# Patient Record
Sex: Male | Born: 1955
Health system: Southern US, Community
[De-identification: ages and names within clinical notes are randomized; demographics above are authoritative.]

## PROBLEM LIST (undated history)

## (undated) DIAGNOSIS — Z8669 Personal history of other diseases of the nervous system and sense organs: Secondary | ICD-10-CM

## (undated) DIAGNOSIS — Z86010 Personal history of colonic polyps: Secondary | ICD-10-CM

## (undated) DIAGNOSIS — Z860101 Personal history of adenomatous and serrated colon polyps: Secondary | ICD-10-CM

## (undated) DIAGNOSIS — Z9989 Dependence on other enabling machines and devices: Secondary | ICD-10-CM

## (undated) DIAGNOSIS — G4730 Sleep apnea, unspecified: Secondary | ICD-10-CM

## (undated) DIAGNOSIS — M1990 Unspecified osteoarthritis, unspecified site: Secondary | ICD-10-CM

## (undated) DIAGNOSIS — K635 Polyp of colon: Secondary | ICD-10-CM

## (undated) DIAGNOSIS — T7840XA Allergy, unspecified, initial encounter: Secondary | ICD-10-CM

## (undated) DIAGNOSIS — N529 Male erectile dysfunction, unspecified: Secondary | ICD-10-CM

## (undated) DIAGNOSIS — G4733 Obstructive sleep apnea (adult) (pediatric): Secondary | ICD-10-CM

## (undated) DIAGNOSIS — H409 Unspecified glaucoma: Secondary | ICD-10-CM

## (undated) DIAGNOSIS — A6000 Herpesviral infection of urogenital system, unspecified: Secondary | ICD-10-CM

## (undated) DIAGNOSIS — N400 Enlarged prostate without lower urinary tract symptoms: Secondary | ICD-10-CM

## (undated) DIAGNOSIS — R399 Unspecified symptoms and signs involving the genitourinary system: Secondary | ICD-10-CM

## (undated) DIAGNOSIS — E785 Hyperlipidemia, unspecified: Secondary | ICD-10-CM

## (undated) DIAGNOSIS — G709 Myoneural disorder, unspecified: Secondary | ICD-10-CM

## (undated) DIAGNOSIS — I10 Essential (primary) hypertension: Secondary | ICD-10-CM

## (undated) DIAGNOSIS — K219 Gastro-esophageal reflux disease without esophagitis: Secondary | ICD-10-CM

## (undated) HISTORY — DX: Herpesviral infection of urogenital system, unspecified: A60.00

## (undated) HISTORY — DX: Myoneural disorder, unspecified: G70.9

## (undated) HISTORY — DX: Essential (primary) hypertension: I10

## (undated) HISTORY — DX: Allergy, unspecified, initial encounter: T78.40XA

## (undated) HISTORY — DX: Hyperlipidemia, unspecified: E78.5

## (undated) HISTORY — PX: BUNIONECTOMY: SHX129

## (undated) HISTORY — DX: Polyp of colon: K63.5

## (undated) HISTORY — PX: EYE SURGERY: SHX253

## (undated) HISTORY — PX: SHOULDER SURGERY: SHX246

---

## 2006-12-14 ENCOUNTER — Ambulatory Visit: Payer: Self-pay | Admitting: Gastroenterology

## 2006-12-28 ENCOUNTER — Encounter: Payer: Self-pay | Admitting: Gastroenterology

## 2006-12-28 ENCOUNTER — Ambulatory Visit: Payer: Self-pay | Admitting: Gastroenterology

## 2006-12-28 HISTORY — PX: COLONOSCOPY: SHX174

## 2008-11-01 ENCOUNTER — Ambulatory Visit (HOSPITAL_BASED_OUTPATIENT_CLINIC_OR_DEPARTMENT_OTHER): Admission: RE | Admit: 2008-11-01 | Discharge: 2008-11-01 | Payer: Self-pay | Admitting: Otolaryngology

## 2008-11-07 ENCOUNTER — Ambulatory Visit: Payer: Self-pay | Admitting: Internal Medicine

## 2010-10-20 ENCOUNTER — Other Ambulatory Visit: Payer: Self-pay | Admitting: Family Medicine

## 2010-10-20 DIAGNOSIS — M79659 Pain in unspecified thigh: Secondary | ICD-10-CM

## 2010-10-24 ENCOUNTER — Ambulatory Visit
Admission: RE | Admit: 2010-10-24 | Discharge: 2010-10-24 | Disposition: A | Discharge status: Home / Self Care | Payer: Self-pay | Source: Ambulatory Visit | Attending: Family Medicine | Admitting: Family Medicine

## 2010-10-24 DIAGNOSIS — M79659 Pain in unspecified thigh: Secondary | ICD-10-CM

## 2010-12-09 NOTE — Procedures (Signed)
NAME:  Robert Carroll, Robert Carroll                 ACCOUNT NO.:  [id number]   MEDICAL RECORD NO.:  [id number]          PATIENT TYPE:  OUT   LOCATION:  SLEEP CENTER                 FACILITY:  [facility]   PHYSICIAN:  Clinton D. Maple Hudson, MD, FCCP, FACPDATE OF BIRTH:  10-23-55   DATE OF STUDY:                            NOCTURNAL POLYSOMNOGRAM   REFERRING PHYSICIAN:   REFERRING PHYSICIAN:  Christopher E. Ezzard Standing, MD   INDICATION FOR STUDY:  Hypersomnia with sleep apnea.   EPWORTH SLEEPINESS SCORE:  Epworth sleepiness score 8/24, BMI 29.7.  Weight 225 pounds, height 73 inches.  Neck 15.5 inches.   MEDICATIONS:  Home medications are charted and reviewed.   SLEEP ARCHITECTURE:  Total sleep time 110 minutes with sleep efficiency  31.8%.  Stage I was 9.5%, stage II was 90.5% of total sleep time.  Stages III and REM were absent.  Sleep latency 24 minutes.  Awake after  sleep onset 193 minutes.  Arousal index 37.1.  Bedtime medication  included lisinopril and melatonin.  He and the sleep technician both  indicated that he had difficulty sleeping and that this was a worse  night than usual with no clarification as to why he had difficulty.   RESPIRATORY DATA:  Apnea/hypopnea index (HPI) 10.9 per hour.  A total of  20 events was scored, all as hypopnea, nonpositional.  There were no REM  events and no REM.  There were insufficient events to permit CPAP  titration by split protocol on the study night.   OXYGEN DATA:  Moderate snoring with oxygen desaturation to a nadir of  81%.  Mean oxygen saturation through the study was 92.3% on room air.   CARDIAC DATA:  Normal sinus rhythm.   MOVEMENT-PARASOMNIA:  No significant movement disturbance.  No bathroom  trips.   IMPRESSIONS-RECOMMENDATIONS:  1. Insomnia with difficulty maintaining sleep for this study night, no      specific explanation.  This apparently was not typical of his sleep      pattern.  2. Mild obstructive sleep apnea/hypopnea syndrome, HPI 10.9  per hour      with nonpositional events, moderate snoring, and oxygen      desaturation to a nadir of 81%.      Clinton D. Maple Hudson, MD, [facility], FACP  Diplomate, Biomedical engineer of Sleep Medicine  Electronically Signed     CDY/MEDQ  D:  11/07/2008 10:34:23  T:  11/07/2008 16:10:96  Job:  [id number]

## 2011-07-25 HISTORY — PX: OTHER SURGICAL HISTORY: SHX169

## 2011-11-01 ENCOUNTER — Encounter: Payer: Self-pay | Admitting: Gastroenterology

## 2011-12-25 ENCOUNTER — Encounter: Payer: Self-pay | Admitting: Gastroenterology

## 2012-02-23 ENCOUNTER — Ambulatory Visit (AMBULATORY_SURGERY_CENTER): Payer: 59 | Admitting: *Deleted

## 2012-02-23 VITALS — Ht 73.0 in | Wt 228.5 lb

## 2012-02-23 DIAGNOSIS — Z1211 Encounter for screening for malignant neoplasm of colon: Secondary | ICD-10-CM

## 2012-02-23 MED ORDER — MOVIPREP 100 G PO SOLR: ORAL | Status: DC

## 2012-02-26 ENCOUNTER — Encounter: Payer: Self-pay | Admitting: Gastroenterology

## 2012-03-08 ENCOUNTER — Ambulatory Visit (AMBULATORY_SURGERY_CENTER): Payer: 59 | Admitting: Gastroenterology

## 2012-03-08 ENCOUNTER — Encounter: Payer: Self-pay | Admitting: Gastroenterology

## 2012-03-08 VITALS — BP 129/77 | HR 56 | Temp 96.7°F | Resp 18 | Ht 73.0 in | Wt 228.0 lb

## 2012-03-08 DIAGNOSIS — D126 Benign neoplasm of colon, unspecified: Secondary | ICD-10-CM

## 2012-03-08 DIAGNOSIS — K635 Polyp of colon: Secondary | ICD-10-CM

## 2012-03-08 DIAGNOSIS — Z86010 Personal history of colonic polyps: Secondary | ICD-10-CM

## 2012-03-08 DIAGNOSIS — Z1211 Encounter for screening for malignant neoplasm of colon: Secondary | ICD-10-CM

## 2012-03-08 MED ORDER — SODIUM CHLORIDE 0.9 % IV SOLN: 500.0000 mL | INTRAVENOUS | Status: DC

## 2012-03-08 NOTE — Op Note (Signed)
 [facility] [address] [place], [place]  [address]  COLONOSCOPY PROCEDURE REPORT  PATIENT:  Robert Carroll, Robert Carroll  MR#:  [id number] BIRTHDATE:  1956-06-30, 55 yrs. old  GENDER:  male ENDOSCOPIST:  Rachael Fee, MD PROCEDURE DATE:  03/08/2012 PROCEDURE:  Colonoscopy with biopsy ASA CLASS:  Class II INDICATIONS:  adenomatous polyp removed 2008 MEDICATIONS:   Fentanyl 50 mcg IV, These medications were titrated to patient response per physician's verbal order, Versed 6 mg IV  DESCRIPTION OF PROCEDURE:   After the risks benefits and alternatives of the procedure were thoroughly explained, informed consent was obtained.  Digital rectal exam was performed and revealed no rectal masses.   The LB [id number] endoscope was introduced through the anus and advanced to the cecum, which was identified by both the appendix and ileocecal valve, without limitations.  The quality of the prep was good..  The instrument was then slowly withdrawn as the colon was fully examined. <<PROCEDUREIMAGES>> FINDINGS:  A diminutive polyp was found in the sigmoid colon. This was removed with biopsy forceps and sent to pathology (jar 1) (see image3).  This was otherwise a normal examination of the colon (see image1, image2, and image4).   Retroflexed views in the rectum revealed no abnormalities. COMPLICATIONS:  None  ENDOSCOPIC IMPRESSION: 1) Diminutive polyp in the sigmoid colon; removed and sent to pathology 2) Otherwise normal examination  RECOMMENDATIONS: 1) Given your personal history of adenomatous (pre-cancerous) polyps, you will need a repeat colonoscopy in 5 years even if the polyp removed today is NOT pre-cancerous 2) You will receive a letter within 1-2 weeks with the results of your biopsy as well as final recommendations. Please call my office if you have not received a letter after 3 weeks.  ______________________________ Rachael Fee, MD  n. eSIGNED:   Rachael Fee at 03/08/2012 10:19 AM  [place] [employer], [phone]

## 2012-03-08 NOTE — Patient Instructions (Addendum)

## 2012-03-08 NOTE — Progress Notes (Signed)
Patient did not experience any of the following events: a burn prior to discharge; a fall within the facility; wrong site/side/patient/procedure/implant event; or a hospital transfer or hospital admission upon discharge from the facility. (G8907) Patient did not have preoperative order for IV antibiotic SSI prophylaxis. (G8918)  

## 2012-03-11 ENCOUNTER — Telehealth: Payer: Self-pay | Admitting: *Deleted

## 2012-03-11 NOTE — Telephone Encounter (Signed)
Left message that we called for f/u 

## 2012-03-14 ENCOUNTER — Encounter: Payer: Self-pay | Admitting: Gastroenterology

## 2014-04-01 ENCOUNTER — Encounter: Payer: Self-pay | Admitting: *Deleted

## 2016-03-03 ENCOUNTER — Ambulatory Visit (INDEPENDENT_AMBULATORY_CARE_PROVIDER_SITE_OTHER): Payer: 59 | Admitting: Diagnostic Neuroimaging

## 2016-03-03 ENCOUNTER — Encounter: Payer: Self-pay | Admitting: Diagnostic Neuroimaging

## 2016-03-03 VITALS — BP 129/85 | HR 71 | Ht 72.0 in | Wt 214.8 lb

## 2016-03-03 DIAGNOSIS — R200 Anesthesia of skin: Secondary | ICD-10-CM

## 2016-03-03 DIAGNOSIS — R253 Fasciculation: Secondary | ICD-10-CM

## 2016-03-03 DIAGNOSIS — R208 Other disturbances of skin sensation: Secondary | ICD-10-CM | POA: Diagnosis not present

## 2016-03-03 DIAGNOSIS — R258 Other abnormal involuntary movements: Secondary | ICD-10-CM | POA: Diagnosis not present

## 2016-03-03 DIAGNOSIS — M25512 Pain in left shoulder: Secondary | ICD-10-CM | POA: Diagnosis not present

## 2016-03-03 MED ORDER — ASPIRIN EC 81 MG PO TBEC: 81.0000 mg | DELAYED_RELEASE_TABLET | Freq: Every day | ORAL | Status: DC

## 2016-03-03 MED ORDER — ALPRAZOLAM 0.5 MG PO TABS: 0.5000 mg | ORAL_TABLET | ORAL | 0 refills | Status: DC | PRN

## 2016-03-03 NOTE — Patient Instructions (Signed)
Thank you for coming to see [place] at [facility]. I hope we have been able to provide you high quality care today.  You may receive a patient satisfaction survey over the next few weeks. We would appreciate your feedback and comments so that we may continue to improve ourselves and the health of our patients.  - monitor symptoms, sleep, stress, food, drink - I will check MRI brain and neck, EEG (brain wave test), carotid ultrasound - continue current medications   ~~~~~~~~~~~~~~~~~~~~~~~~~~~~~~~~~~~~~~~~~~~~~~~~~~~~~~~~~~~~~~~~~  DR. Jakiyah Stepney'S GUIDE TO HAPPY AND HEALTHY LIVING These are some of my general health and wellness recommendations. Some of them may apply to you better than others. Please use common sense as you try these suggestions and feel free to ask me any questions.   ACTIVITY/FITNESS Mental, social, emotional and physical stimulation are very important for brain and body health. Try learning a new activity (arts, music, language, sports, games).  Keep moving your body to the best of your abilities. You can do this at home, inside or outside, the park, community center, gym or anywhere you like. Consider a physical therapist or personal trainer to get started. Consider the app Sworkit. Fitness trackers such as smart-watches, smart-phones or Fitbits can help as well.   NUTRITION Eat more plants: colorful vegetables, nuts, seeds and berries.  Eat less sugar, salt, preservatives and processed foods.  Avoid toxins such as cigarettes and alcohol.  Drink water when you are thirsty. Warm water with a slice of lemon is an excellent morning drink to start the day.  Consider these websites for more information The Nutrition Source (https://www.henry-hernandez.biz/) Precision Nutrition (WindowBlog.ch)   RELAXATION Consider practicing mindfulness meditation or other relaxation techniques such as deep breathing,  prayer, yoga, tai chi, massage. See website mindful.org or the apps Headspace or Calm to help get started.   SLEEP Try to get at least 7-8+ hours sleep per day. Regular exercise and reduced caffeine will help you sleep better. Practice good sleep hygeine techniques. See website sleep.org for more information.   PLANNING Prepare estate planning, living will, healthcare POA documents. Sometimes this is best planned with the help of an attorney. Theconversationproject.org and agingwithdignity.org are excellent resources.

## 2016-03-03 NOTE — Progress Notes (Signed)
[facility]  PATIENT: Robert Carroll DOB: 07-15-1956  REFERRING CLINICIAN: K Supple HISTORY FROM: patient  REASON FOR VISIT: new consult    HISTORICAL  CHIEF COMPLAINT:  Chief Complaint  Patient presents with  . Numbness    rm 6, New Pt "numbness episodes periodically in L shoulder radiaitng down L arm (2.5-3 mos). Experiences weak sensation at L shoulder "    HISTORY OF PRESENT ILLNESS:   60 year old right-handed male here for evaluation of left upper extremity numbness and tingling. May 2017 patient had onset of left shoulder numbness and tingling sensation which would slowly moved down his arm into his fingers. The progression would slowly occur over 30-60 seconds and then resolve. No specific triggering factors. Patient was having multiple attacks per day, up to 3-4/hour. Initial attacks or sometimes associated with mild twitching of his left hand and fingers. Patient had some left axillary pain sensation, history of right shoulder problem, and therefore followed up with his orthopedic surgeon. Patient was evaluated for possible impingement syndrome of the left shoulder and treated with trigger point injection. Patient referred to me for evaluation and consideration of alternate explanation of the symptoms.  Over time the attacks have continued although slightly decreased in intensity and frequency. Patient denies any specific neck pain. No prodromal injuries, accidents, traumas. No history of seizure, TIA or stroke. No family history of seizure.   Patient denies any left face or left leg numbness or tingling. No loss of consciousness or altered consciousness attacks.   REVIEW OF SYSTEMS: Full 14 system review of systems performed and negative with exception of: Rash itching snoring joint pain allergies numbness weakness snoring.  ALLERGIES: No Known Allergies  HOME MEDICATIONS: Outpatient Medications Prior to Visit  Medication Sig Dispense Refill  .  amLODipine-benazepril (LOTREL) 5-20 MG per capsule Take 1 capsule by mouth daily.     Marland Kitchen CIALIS 20 MG tablet Take 20 mg by mouth as needed.     . doxazosin (CARDURA) 4 MG tablet Take 4 mg by mouth at bedtime.     . Flax Oil-Fish Oil-Borage Oil (FISH OIL-FLAX OIL-BORAGE OIL PO) Take by mouth daily.    Marland Kitchen glucosamine-chondroitin 500-400 MG tablet Take 1 tablet by mouth daily.    . Misc Natural Products (COLON CLEANSER PO) Take by mouth as needed.    . Multiple Vitamins-Minerals (MULTIVITAMIN PO) Take by mouth.    Marland Kitchen omeprazole (PRILOSEC) 20 MG capsule Take 20 mg by mouth daily.     . valACYclovir (VALTREX) 1000 MG tablet Take 1,000 mg by mouth as needed.     . simvastatin (ZOCOR) 20 MG tablet Take 20 mg by mouth at bedtime.      No facility-administered medications prior to visit.     PAST MEDICAL HISTORY: Past Medical History:  Diagnosis Date  . Colon polyp    adenomatous, 2008  . Genital herpes   . Hyperlipidemia   . Hypertension     PAST SURGICAL HISTORY: Past Surgical History:  Procedure Laterality Date  . SHOULDER SURGERY  1974, 1977   right    FAMILY HISTORY: Family History  Problem Relation Age of Onset  . Heart disease Mother   . Stroke Mother   . Heart disease Father   . Hypertension Father   . Diabetes Father   . Heart disease Sister   . Lupus Sister   . Heart disease Brother   . Hypertension Brother   . Diabetes Brother   . Heart disease Maternal Aunt   .  Heart disease Maternal Uncle   . Cancer Maternal Uncle     prostate  . Cancer Paternal Uncle     prostate  . Colon cancer Neg Hx   . Stomach cancer Neg Hx     SOCIAL HISTORY:  Social History   Social History  . Marital status: Married    Spouse name: N/A  . Number of children: 3  . Years of education: 79   Occupational History  .      [employer], administration   Social History Main Topics  . Smoking status: Never Smoker  . Smokeless tobacco: Never Used  . Alcohol use 2.0 oz/week     4 Standard drinks or equivalent per week     Comment: 3-4 weekly  . Drug use: No  . Sexual activity: Not on file   Other Topics Concern  . Not on file   Social History Narrative  . No narrative on file     PHYSICAL EXAM  GENERAL EXAM/CONSTITUTIONAL: Vitals:  Vitals:   03/03/16 0926  BP: 129/85  Pulse: 71  Weight: 214 lb 12.8 oz (97.4 kg)  Height: 6' (1.829 m)     Body mass index is 29.13 kg/m.  Visual Acuity Screening   Right eye Left eye Both eyes  Without correction: 20/30 20/30   With correction:        Patient is in no distress; well developed, nourished and groomed; neck is supple  CARDIOVASCULAR:  Examination of carotid arteries is normal; no carotid bruits  Regular rate and rhythm, no murmurs  Examination of peripheral vascular system by observation and palpation is normal  EYES:  Ophthalmoscopic exam of optic discs and posterior segments is normal; no papilledema or hemorrhages  MUSCULOSKELETAL:  Gait, strength, tone, movements noted in Neurologic exam below  NEUROLOGIC: MENTAL STATUS:  No flowsheet data found.  awake, alert, oriented to person, place and time  recent and remote memory intact  normal attention and concentration  language fluent, comprehension intact, naming intact,   fund of knowledge appropriate  CRANIAL NERVE:   2nd - no papilledema on fundoscopic exam  2nd, 3rd, 4th, 6th - pupils equal and reactive to light, visual fields full to confrontation, extraocular muscles intact, no nystagmus  5th - facial sensation symmetric  7th - facial strength symmetric  8th - hearing intact  9th - palate elevates symmetrically, uvula midline  11th - shoulder shrug symmetric  12th - tongue protrusion midline  MOTOR:   normal bulk and tone, full strength in the BUE, BLE  SENSORY:   normal and symmetric to light touch, temperature, vibration  COORDINATION:   finger-nose-finger, fine finger movements  normal  REFLEXES:   deep tendon reflexes present and symmetric  GAIT/STATION:   narrow based gait; able to walk tandem; romberg is negative    DIAGNOSTIC DATA (LABS, IMAGING, TESTING) - I reviewed patient records, labs, notes, testing and imaging myself where available.  No results found for: WBC, HGB, HCT, MCV, PLT No results found for: NA, K, CL, CO2, GLUCOSE, BUN, CREATININE, CALCIUM, PROT, ALBUMIN, AST, ALT, ALKPHOS, BILITOT, GFRNONAA, GFRAA No results found for: CHOL, HDL, LDLCALC, LDLDIRECT, TRIG, CHOLHDL No results found for: HGBA1C No results found for: VITAMINB12 No results found for: TSH     ASSESSMENT AND PLAN  60 y.o. year old male here with new onset intermittent attacks of left upper extremity numbness and tingling, with slow progression from left shoulder to the left hand and fingers. The patient's description  is highly suspicious for jacksonian march and partial seizure events. Left cervical radiculopathy, left arm peripheral neuropathy or transient ischemic attack are other considerations.   Localization: right brain (parietal), left cervical nerve roots, left arm peripheral nerves  Ddx: partial seizure, TIA, left cervical radiculopathy, left arm peripheral neuropathy  1. Left arm numbness   2. Muscle twitching   3. Left shoulder pain      PLAN: - MRI brain, cervical spine - EEG - carotid u/s  Orders Placed This Encounter  Procedures  . MR Brain W Wo Contrast  . MR Cervical Spine Wo Contrast  . EEG adult   Meds ordered this encounter  Medications  . ALPRAZolam (XANAX) 0.5 MG tablet    Sig: Take 1 tablet (0.5 mg total) by mouth as needed for anxiety (for sedation before MRI scan; take 1 hour before scan; may repeat 15 min before scan).    Dispense:  3 tablet    Refill:  0   Return in about 6 weeks (around 04/14/2016).  I reviewed images, labs, notes, records myself. I summarized findings and reviewed with patient, for this high risk condition  (seizure, TIA, cervical radiculopathy; left arm numbness) requiring high complexity decision making.     Penni Bombard, MD [id number], [id number] AM Certified in Neurology, Neurophysiology and Neuroimaging  [facility] [address] [place], [place] [address] [phone]

## 2016-03-10 ENCOUNTER — Ambulatory Visit (HOSPITAL_COMMUNITY)
Admission: RE | Admit: 2016-03-10 | Discharge: 2016-03-10 | Disposition: A | Discharge status: Home / Self Care | Payer: 59 | Source: Ambulatory Visit | Attending: Cardiovascular Disease | Admitting: Cardiovascular Disease

## 2016-03-10 DIAGNOSIS — I10 Essential (primary) hypertension: Secondary | ICD-10-CM | POA: Insufficient documentation

## 2016-03-10 DIAGNOSIS — E785 Hyperlipidemia, unspecified: Secondary | ICD-10-CM | POA: Diagnosis not present

## 2016-03-10 DIAGNOSIS — R200 Anesthesia of skin: Secondary | ICD-10-CM

## 2016-03-10 DIAGNOSIS — I6523 Occlusion and stenosis of bilateral carotid arteries: Secondary | ICD-10-CM | POA: Insufficient documentation

## 2016-03-10 DIAGNOSIS — R208 Other disturbances of skin sensation: Secondary | ICD-10-CM | POA: Insufficient documentation

## 2016-03-16 ENCOUNTER — Telehealth: Payer: Self-pay | Admitting: Diagnostic Neuroimaging

## 2016-03-16 ENCOUNTER — Telehealth: Payer: Self-pay | Admitting: *Deleted

## 2016-03-16 NOTE — Telephone Encounter (Signed)
Returned patient's call and left him a VM asking him to return my call.

## 2016-03-16 NOTE — Telephone Encounter (Signed)
Pt called back to speak with Danielle. She will call him back .

## 2016-03-16 NOTE — Telephone Encounter (Signed)
Pt called to set up MRI appt

## 2016-03-16 NOTE — Telephone Encounter (Signed)
Per Dr Leta Baptist, spoke with patient and informed him his carotid US results are unremarkable; Dr Leta Baptist will continue with his current treatment plan. Patient stated his MRI and EEG are scheduled. Advised he will get a call with those results when available. He verbalized understanding, appreciation for call.

## 2016-03-16 NOTE — Telephone Encounter (Signed)
Called patient back and scheduled injection.

## 2016-03-16 NOTE — Telephone Encounter (Signed)
Pt returned call

## 2016-03-22 ENCOUNTER — Ambulatory Visit (INDEPENDENT_AMBULATORY_CARE_PROVIDER_SITE_OTHER): Payer: 59

## 2016-03-22 DIAGNOSIS — R200 Anesthesia of skin: Secondary | ICD-10-CM

## 2016-03-22 DIAGNOSIS — R208 Other disturbances of skin sensation: Secondary | ICD-10-CM | POA: Diagnosis not present

## 2016-03-22 DIAGNOSIS — R258 Other abnormal involuntary movements: Secondary | ICD-10-CM | POA: Diagnosis not present

## 2016-03-22 DIAGNOSIS — M25512 Pain in left shoulder: Secondary | ICD-10-CM

## 2016-03-22 DIAGNOSIS — R253 Fasciculation: Secondary | ICD-10-CM

## 2016-03-23 MED ORDER — GADOPENTETATE DIMEGLUMINE 469.01 MG/ML IV SOLN: 20.0000 mL | Freq: Once | INTRAVENOUS | Status: AC | PRN

## 2016-03-28 ENCOUNTER — Telehealth: Payer: Self-pay | Admitting: *Deleted

## 2016-03-28 NOTE — Telephone Encounter (Signed)
Spoke with patient who inquired about his MRI of neck. He stated he was told it wasn't approved but an appeal could be made.Robert Carroll He stated he wanted to know if it should be scheduled now or wait until his EEG is done. Advised him that since his EEG is this Thurs, MRI may not be able to be done before that date anyway.  Advised him this RN will send message to Andee Poles to check into this and call him back. He verbalized understanding, appreciation.

## 2016-03-28 NOTE — Telephone Encounter (Signed)
Per Dr Leta Baptist, lvm informing patient his MRI brain results are unremarkable. Dr Leta Baptist will continue with his current treatment plan. Reminded him of EEG on 03/30/16 and FU on 04/14/16. Left name, number for any questions.

## 2016-03-28 NOTE — Telephone Encounter (Signed)
Patient is calling regarding his MRI. He has questions.

## 2016-03-30 ENCOUNTER — Ambulatory Visit (INDEPENDENT_AMBULATORY_CARE_PROVIDER_SITE_OTHER): Payer: 59 | Admitting: Diagnostic Neuroimaging

## 2016-03-30 DIAGNOSIS — R208 Other disturbances of skin sensation: Secondary | ICD-10-CM | POA: Diagnosis not present

## 2016-03-30 DIAGNOSIS — R253 Fasciculation: Secondary | ICD-10-CM

## 2016-03-30 DIAGNOSIS — M25512 Pain in left shoulder: Secondary | ICD-10-CM

## 2016-03-30 DIAGNOSIS — R200 Anesthesia of skin: Secondary | ICD-10-CM

## 2016-04-06 NOTE — Telephone Encounter (Signed)
Spoke with patient. His cervical MRI was denied. I spoke with the patient the day of the apt and let him know. I can file an appeal if Dr. Leta Baptist thinks it is necessary. Patient stated that he is still waiting for results of EEG. Please call and advise.

## 2016-04-06 NOTE — Telephone Encounter (Addendum)
Per Dr Leta Baptist, spoke with patient and informed him his EEG results are normal.  He then asked if his MRI will be appealed.  He stated that since his other tests were normal, and he is still having problems he thinks MRI of his neck  should be done. Informed him this RN will route to Dr Leta Baptist. Did advise Dr Leta Baptist and this RN are out of the office tomorrow, return Monday. However did inform him the other RNs cover phone calls as they are able. He verbalized understanding, appreciation.  Message routed to Julian Reil, who does Biomedical engineer for [employer]. She will FU with patient.

## 2016-04-06 NOTE — Telephone Encounter (Signed)
EEG is normal .- VRP 

## 2016-04-07 NOTE — Procedures (Signed)
   [facility]  EEG (ELECTROENCEPHALOGRAM) REPORT   STUDY DATE: 03/30/16 PATIENT NAME: Robert Carroll DOB: 1956/02/10 MRN: [id number]  ORDERING CLINICIAN: Andrey Spearman, MD   TECHNOLOGIST: Laretta Alstrom  TECHNIQUE: Electroencephalogram was recorded utilizing standard 10-20 system of lead placement and reformatted into average and bipolar montages.  RECORDING TIME: 27 minutes  ACTIVATION: hyperventilation and photic stimulation  CLINICAL INFORMATION: 60 year old male with intermittent numbness and weakness.  FINDINGS: Background rhythms of 9-10 hertz and 20-30 microvolts. No focal, lateralizing, epileptiform activity or seizures are seen. Patient recorded in the awake and drowsy state. EKG channel shows regular rhythm of 60 beats per minute.   IMPRESSION:  Normal EEG in the awake and drowsy states.     INTERPRETING PHYSICIAN:  Penni Bombard, MD Certified in Neurology, Neurophysiology and Neuroimaging  [facility] [address], [place], [place] [address] [phone]

## 2016-04-14 ENCOUNTER — Ambulatory Visit (INDEPENDENT_AMBULATORY_CARE_PROVIDER_SITE_OTHER): Payer: 59 | Admitting: Diagnostic Neuroimaging

## 2016-04-14 ENCOUNTER — Encounter: Payer: Self-pay | Admitting: Diagnostic Neuroimaging

## 2016-04-14 VITALS — BP 117/73 | HR 82 | Ht 72.0 in | Wt 207.2 lb

## 2016-04-14 DIAGNOSIS — R200 Anesthesia of skin: Secondary | ICD-10-CM

## 2016-04-14 DIAGNOSIS — R208 Other disturbances of skin sensation: Secondary | ICD-10-CM

## 2016-04-14 DIAGNOSIS — R258 Other abnormal involuntary movements: Secondary | ICD-10-CM

## 2016-04-14 DIAGNOSIS — M25512 Pain in left shoulder: Secondary | ICD-10-CM | POA: Diagnosis not present

## 2016-04-14 DIAGNOSIS — R253 Fasciculation: Secondary | ICD-10-CM

## 2016-04-14 NOTE — Progress Notes (Signed)
[facility]  PATIENT: Robert Carroll DOB: 12-Jun-1956  REFERRING CLINICIAN: K Supple HISTORY FROM: patient  REASON FOR VISIT: follow up    HISTORICAL  CHIEF COMPLAINT:  Chief Complaint  Patient presents with  . Left Arm Numbness    Reports his symptoms have mildly improved. He would like to further discuss his MRI and EEG results.    HISTORY OF PRESENT ILLNESS:   UPDATE 04/14/16: Since last visit, doing better now. Now going to [facility], using sauna, going to chiropractor. Symptoms are less frequent and less severe. Test results normal.   PRIOR HPI (03/03/16): 60 year old right-handed male here for evaluation of left upper extremity numbness and tingling. May 2017 patient had onset of left shoulder numbness and tingling sensation which would slowly moved down his arm into his fingers. The progression would slowly occur over 30-60 seconds and then resolve. No specific triggering factors. Patient was having multiple attacks per day, up to 3-4/hour. Initial attacks or sometimes associated with mild twitching of his left hand and fingers. Patient had some left axillary pain sensation, history of right shoulder problem, and therefore followed up with his orthopedic surgeon. Patient was evaluated for possible impingement syndrome of the left shoulder and treated with trigger point injection. Patient referred to me for evaluation and consideration of alternate explanation of the symptoms. Over time the attacks have continued although slightly decreased in intensity and frequency. Patient denies any specific neck pain. No prodromal injuries, accidents, traumas. No history of seizure, TIA or stroke. No family history of seizure. Patient denies any left face or left leg numbness or tingling. No loss of consciousness or altered consciousness attacks.   REVIEW OF SYSTEMS: Full 14 system review of systems performed and negative with exception of: joint pain numbness   ALLERGIES: No  Known Allergies  HOME MEDICATIONS: Outpatient Medications Prior to Visit  Medication Sig Dispense Refill  . ALPRAZolam (XANAX) 0.5 MG tablet Take 1 tablet (0.5 mg total) by mouth as needed for anxiety (for sedation before MRI scan; take 1 hour before scan; may repeat 15 min before scan). 3 tablet 0  . amLODipine-benazepril (LOTREL) 5-20 MG per capsule Take 1 capsule by mouth daily.     Marland Kitchen aspirin EC 81 MG tablet Take 1 tablet (81 mg total) by mouth daily.    Marland Kitchen atorvastatin (LIPITOR) 20 MG tablet 20 mg daily.    . cabergoline (DOSTINEX) 0.5 MG tablet 0.5 mg. Twice a week    . CIALIS 20 MG tablet Take 20 mg by mouth as needed.     . doxazosin (CARDURA) 4 MG tablet Take 4 mg by mouth at bedtime.     . Flax Oil-Fish Oil-Borage Oil (FISH OIL-FLAX OIL-BORAGE OIL PO) Take by mouth daily.    Marland Kitchen glucosamine-chondroitin 500-400 MG tablet Take 1 tablet by mouth daily.    . meloxicam (MOBIC) 15 MG tablet 15 mg daily.    . Misc Natural Products (COLON CLEANSER PO) Take by mouth as needed.    . Multiple Vitamins-Minerals (MULTIVITAMIN PO) Take by mouth.    Marland Kitchen omeprazole (PRILOSEC) 20 MG capsule Take 20 mg by mouth daily.     . valACYclovir (VALTREX) 1000 MG tablet Take 1,000 mg by mouth as needed.      Facility-Administered Medications Prior to Visit  Medication Dose Route Frequency Provider Last Rate Last Dose  . gadopentetate dimeglumine (MAGNEVIST) injection 20 mL  20 mL Intravenous Once PRN Penni Bombard, MD  PAST MEDICAL HISTORY: Past Medical History:  Diagnosis Date  . Colon polyp    adenomatous, 2008  . Genital herpes   . Hyperlipidemia   . Hypertension     PAST SURGICAL HISTORY: Past Surgical History:  Procedure Laterality Date  . SHOULDER SURGERY  1974, 1977   right    FAMILY HISTORY: Family History  Problem Relation Age of Onset  . Heart disease Mother   . Stroke Mother   . Heart disease Father   . Hypertension Father   . Diabetes Father   . Heart disease Sister    . Lupus Sister   . Heart disease Brother   . Hypertension Brother   . Diabetes Brother   . Heart disease Maternal Aunt   . Heart disease Maternal Uncle   . Cancer Maternal Uncle     prostate  . Cancer Paternal Uncle     prostate  . Colon cancer Neg Hx   . Stomach cancer Neg Hx     SOCIAL HISTORY:  Social History   Social History  . Marital status: Married    Spouse name: N/A  . Number of children: 3  . Years of education: 50   Occupational History  .      [employer], administration   Social History Main Topics  . Smoking status: Never Smoker  . Smokeless tobacco: Never Used  . Alcohol use 2.0 oz/week    4 Standard drinks or equivalent per week     Comment: 3-4 weekly  . Drug use: No  . Sexual activity: Not on file   Other Topics Concern  . Not on file   Social History Narrative  . No narrative on file     PHYSICAL EXAM  GENERAL EXAM/CONSTITUTIONAL: Vitals:  Vitals:   04/14/16 1131  BP: 117/73  Pulse: 82  Weight: 207 lb 3.2 oz (94 kg)  Height: 6' (1.829 m)   Body mass index is 28.1 kg/m. No exam data present  Patient is in no distress; well developed, nourished and groomed; neck is supple  CARDIOVASCULAR:  Examination of carotid arteries is normal; no carotid bruits  Regular rate and rhythm, no murmurs  Examination of peripheral vascular system by observation and palpation is normal  EYES:  Ophthalmoscopic exam of optic discs and posterior segments is normal; no papilledema or hemorrhages  MUSCULOSKELETAL:  Gait, strength, tone, movements noted in Neurologic exam below  NEUROLOGIC: MENTAL STATUS:  No flowsheet data found.  awake, alert, oriented to person, place and time  recent and remote memory intact  normal attention and concentration  language fluent, comprehension intact, naming intact,   fund of knowledge appropriate  CRANIAL NERVE:   2nd - no papilledema on fundoscopic exam  2nd, 3rd, 4th, 6th - pupils  equal and reactive to light, visual fields full to confrontation, extraocular muscles intact, no nystagmus  5th - facial sensation symmetric  7th - facial strength symmetric  8th - hearing intact  9th - palate elevates symmetrically, uvula midline  11th - shoulder shrug symmetric  12th - tongue protrusion midline  MOTOR:   normal bulk and tone, full strength in the BUE, BLE  SENSORY:   normal and symmetric to light touch  COORDINATION:   finger-nose-finger, fine finger movements normal  REFLEXES:   deep tendon reflexes present and symmetric  GAIT/STATION:   narrow based gait; able to walk tandem    DIAGNOSTIC DATA (LABS, IMAGING, TESTING) - I reviewed patient records, labs, notes, testing and  imaging myself where available.  No results found for: WBC, HGB, HCT, MCV, PLT No results found for: NA, K, CL, CO2, GLUCOSE, BUN, CREATININE, CALCIUM, PROT, ALBUMIN, AST, ALT, ALKPHOS, BILITOT, GFRNONAA, GFRAA No results found for: CHOL, HDL, LDLCALC, LDLDIRECT, TRIG, CHOLHDL No results found for: HGBA1C No results found for: VITAMINB12 No results found for: TSH   03/22/16 MRI brain [I reviewed images myself and agree with interpretation. -VRP]  - normal  04/07/16 EEG [I reviewed images myself and agree with interpretation. -VRP]  - normal  03/10/16 Carotid u/s - normal    ASSESSMENT AND PLAN  60 y.o. year old male here with new onset intermittent attacks of left upper extremity numbness and tingling, with slow progression from left shoulder to the left hand and fingers. The patient's description is highly suspicious for jacksonian march and partial seizure events, but MRI brain and EEG were normal (including pt report of having 1 event during EEG). Left cervical radiculopathy or left arm peripheral neuropathy are other considerations.   Localization: right brain (parietal), left cervical nerve roots, left arm peripheral nerves  Ddx: partial seizure, left cervical  radiculopathy, left arm peripheral neuropathy  1. Left arm numbness   2. Muscle twitching   3. Left shoulder pain      PLAN: I spent 15 minutes of face to face time with patient. Greater than 50% of time was spent in counseling and coordination of care with patient. In summary we discussed:  - monitor symptoms - may consider MRI cervical spine in future (was denied by insurance; now sxs improving so will hold off on further testing)  Return if symptoms worsen or fail to improve, for return to PCP.     Penni Bombard, MD [id number], [id number] PM Certified in Neurology, Neurophysiology and [facility] [address], [place], [place] [address] [phone]

## 2016-04-14 NOTE — Patient Instructions (Signed)
-   monitor symptoms

## 2016-06-26 ENCOUNTER — Telehealth: Payer: Self-pay

## 2016-06-26 ENCOUNTER — Telehealth: Payer: Self-pay | Admitting: *Deleted

## 2016-06-26 NOTE — Telephone Encounter (Signed)
LVM advising patient that if is continuing to have same issues he was seen in Sept for, then phone staff may schedule a follow up for him to come back and see Dr Leta Baptist. Otherwise he may call back and schedule to be seen with new problem. Left name, number.

## 2016-06-26 NOTE — Telephone Encounter (Signed)
Spoke with patient and advised him the earliest appointment is 08/02/16. Advised he can be called if there is a cancellation before hand. He verbalized understanding, agreement to come in on 08/02/16. He verbalized appreciation.

## 2016-06-26 NOTE — Telephone Encounter (Signed)
Patient is calling back and received previous message. The patient is still having problems with left arm numbness and seems to be getting worse. The patient needs a soon appointment and none are available.

## 2016-06-26 NOTE — Telephone Encounter (Signed)
This patient is having same problem and wants to schedule a 3 month fu please call him [phone]

## 2016-07-20 NOTE — Telephone Encounter (Signed)
error 

## 2016-07-28 DIAGNOSIS — M25552 Pain in left hip: Secondary | ICD-10-CM | POA: Diagnosis not present

## 2016-07-28 DIAGNOSIS — M1612 Unilateral primary osteoarthritis, left hip: Secondary | ICD-10-CM | POA: Diagnosis not present

## 2016-07-28 DIAGNOSIS — M1611 Unilateral primary osteoarthritis, right hip: Secondary | ICD-10-CM | POA: Diagnosis not present

## 2016-07-28 DIAGNOSIS — M25551 Pain in right hip: Secondary | ICD-10-CM | POA: Diagnosis not present

## 2016-08-01 DIAGNOSIS — M1612 Unilateral primary osteoarthritis, left hip: Secondary | ICD-10-CM | POA: Diagnosis not present

## 2016-08-02 ENCOUNTER — Encounter: Payer: Self-pay | Admitting: Diagnostic Neuroimaging

## 2016-08-02 ENCOUNTER — Ambulatory Visit (INDEPENDENT_AMBULATORY_CARE_PROVIDER_SITE_OTHER): Payer: 59 | Admitting: Diagnostic Neuroimaging

## 2016-08-02 VITALS — BP 110/76 | HR 87 | Wt 217.6 lb

## 2016-08-02 DIAGNOSIS — R253 Fasciculation: Secondary | ICD-10-CM

## 2016-08-02 DIAGNOSIS — G8929 Other chronic pain: Secondary | ICD-10-CM

## 2016-08-02 DIAGNOSIS — R200 Anesthesia of skin: Secondary | ICD-10-CM | POA: Diagnosis not present

## 2016-08-02 DIAGNOSIS — M25512 Pain in left shoulder: Secondary | ICD-10-CM

## 2016-08-02 MED ORDER — GABAPENTIN 300 MG PO CAPS: 300.0000 mg | ORAL_CAPSULE | Freq: Two times a day (BID) | ORAL | 6 refills | Status: DC

## 2016-08-02 NOTE — Patient Instructions (Addendum)
Thank you for coming to see [place] at [facility]. I hope we have been able to provide you high quality care today.  You may receive a patient satisfaction survey over the next few weeks. We would appreciate your feedback and comments so that we may continue to improve ourselves and the health of our patients.  - start gabapentin 340m at bedtime; after 1-2 weeks increase to twice a day; then may increase to 2 tabs twice a day  - continue monitoring left arm symptoms   ~~~~~~~~~~~~~~~~~~~~~~~~~~~~~~~~~~~~~~~~~~~~~~~~~~~~~~~~~~~~~~~~~  DR. Valorie Mcgrory'S GUIDE TO HAPPY AND HEALTHY LIVING These are some of my general health and wellness recommendations. Some of them may apply to you better than others. Please use common sense as you try these suggestions and feel free to ask me any questions.   ACTIVITY/FITNESS Mental, social, emotional and physical stimulation are very important for brain and body health. Try learning a new activity (arts, music, language, sports, games).  Keep moving your body to the best of your abilities. You can do this at home, inside or outside, the park, community center, gym or anywhere you like. Consider a physical therapist or personal trainer to get started. Consider the app Sworkit. Fitness trackers such as smart-watches, smart-phones or Fitbits can help as well.   NUTRITION Eat more plants: colorful vegetables, nuts, seeds and berries.  Eat less sugar, salt, preservatives and processed foods.  Avoid toxins such as cigarettes and alcohol.  Drink water when you are thirsty. Warm water with a slice of lemon is an excellent morning drink to start the day.  Consider these websites for more information The Nutrition Source (hhttps://www.henry-hernandez.biz/ [employer] Nutrition (wWindowBlog.ch   RELAXATION Consider practicing mindfulness meditation or other relaxation techniques such as deep breathing,  prayer, yoga, tai chi, massage. See website mindful.org or the apps Headspace or Calm to help get started.   SLEEP Try to get at least 7-8+ hours sleep per day. Regular exercise and reduced caffeine will help you sleep better. Practice good sleep hygeine techniques. See website sleep.org for more information.   PLANNING Prepare estate planning, living will, healthcare POA documents. Sometimes this is best planned with the help of an attorney. Theconversationproject.org and agingwithdignity.org are excellent resources.

## 2016-08-02 NOTE — Progress Notes (Signed)
[facility]  PATIENT: Robert Carroll DOB: [birth year]  REFERRING CLINICIAN: K Supple HISTORY FROM: patient  REASON FOR VISIT: follow up    HISTORICAL  CHIEF COMPLAINT:  Chief Complaint  Patient presents with  . Left arm numbness    rm 6, "left arm intensified in Dec; like pressure/tightness in L shoulder, numbness goes down to wrist, gets worse when I lay on right side"  . Follow-up    requested for worsening symptoms    HISTORY OF PRESENT ILLNESS:   UPDATE 08/02/16: Since last visit, was doing well until December 2017, then more intense symptoms and flare up. In the left arm there is the muscle tightness/numbness sensation. Sxs continue on daily basis; 2-4 per hour at least; lasting up to 1 minute at a time. Sometimes there is post-ictal weakness.   UPDATE 04/14/16: Since last visit, doing better now. Now going to [facility], using sauna, going to chiropractor. Symptoms are less frequent and less severe. Test results normal.   PRIOR HPI (03/03/16): 61 year old right-handed male here for evaluation of left upper extremity numbness and tingling. May 2017 patient had onset of left shoulder numbness and tingling sensation which would slowly moved down his arm into his fingers. The progression would slowly occur over 30-60 seconds and then resolve. No specific triggering factors. Patient was having multiple attacks per day, up to 3-4/hour. Initial attacks or sometimes associated with mild twitching of his left hand and fingers. Patient had some left axillary pain sensation, history of right shoulder problem, and therefore followed up with his orthopedic surgeon. Patient was evaluated for possible impingement syndrome of the left shoulder and treated with trigger point injection. Patient referred to me for evaluation and consideration of alternate explanation of the symptoms. Over time the attacks have continued although slightly decreased in intensity and frequency. Patient denies  any specific neck pain. No prodromal injuries, accidents, traumas. No history of seizure, TIA or stroke. No family history of seizure. Patient denies any left face or left leg numbness or tingling. No loss of consciousness or altered consciousness attacks.   REVIEW OF SYSTEMS: Full 14 system review of systems performed and negative with exception of: joint pain numbness   ALLERGIES: No Known Allergies  HOME MEDICATIONS: Outpatient Medications Prior to Visit  Medication Sig Dispense Refill  . ALPRAZolam (XANAX) 0.5 MG tablet Take 1 tablet (0.5 mg total) by mouth as needed for anxiety (for sedation before MRI scan; take 1 hour before scan; may repeat 15 min before scan). 3 tablet 0  . amLODipine-benazepril (LOTREL) 5-20 MG per capsule Take 1 capsule by mouth daily.     Marland Kitchen aspirin EC 81 MG tablet Take 1 tablet (81 mg total) by mouth daily.    Marland Kitchen atorvastatin (LIPITOR) 20 MG tablet 20 mg daily.    . cabergoline (DOSTINEX) 0.5 MG tablet 0.5 mg. Twice a week    . CIALIS 20 MG tablet Take 20 mg by mouth as needed.     . doxazosin (CARDURA) 4 MG tablet Take 4 mg by mouth at bedtime.     . Flax Oil-Fish Oil-Borage Oil (FISH OIL-FLAX OIL-BORAGE OIL PO) Take by mouth daily.    Marland Kitchen glucosamine-chondroitin 500-400 MG tablet Take 1 tablet by mouth daily.    . meloxicam (MOBIC) 15 MG tablet 15 mg daily.    . Misc Natural Products (COLON CLEANSER PO) Take by mouth as needed.    . Multiple Vitamins-Minerals (MULTIVITAMIN PO) Take by mouth.    Marland Kitchen omeprazole (PRILOSEC)  20 MG capsule Take 20 mg by mouth daily.     . valACYclovir (VALTREX) 1000 MG tablet Take 1,000 mg by mouth as needed.      Facility-Administered Medications Prior to Visit  Medication Dose Route Frequency Provider Last Rate Last Dose  . gadopentetate dimeglumine (MAGNEVIST) injection 20 mL  20 mL Intravenous Once PRN Penni Bombard, MD        PAST MEDICAL HISTORY: Past Medical History:  Diagnosis Date  . Colon polyp    adenomatous,  2008  . Genital herpes   . Hyperlipidemia   . Hypertension     PAST SURGICAL HISTORY: Past Surgical History:  Procedure Laterality Date  . SHOULDER SURGERY  1974, 1977   right    FAMILY HISTORY: Family History  Problem Relation Age of Onset  . Heart disease Mother   . Stroke Mother   . Heart disease Father   . Hypertension Father   . Diabetes Father   . Heart disease Sister   . Lupus Sister   . Heart disease Brother   . Hypertension Brother   . Diabetes Brother   . Heart disease Maternal Aunt   . Heart disease Maternal Uncle   . Cancer Maternal Uncle     prostate  . Cancer Paternal Uncle     prostate  . Colon cancer Neg Hx   . Stomach cancer Neg Hx     SOCIAL HISTORY:  Social History   Social History  . Marital status: Married    Spouse name: N/A  . Number of children: 3  . Years of education: 58   Occupational History  .      [employer], administration   Social History Main Topics  . Smoking status: Never Smoker  . Smokeless tobacco: Never Used  . Alcohol use 2.0 oz/week    4 Standard drinks or equivalent per week     Comment: 3-4 weekly  . Drug use: No  . Sexual activity: Not on file   Other Topics Concern  . Not on file   Social History Narrative  . No narrative on file     PHYSICAL EXAM  GENERAL EXAM/CONSTITUTIONAL: Vitals:  Vitals:   08/02/16 1311  BP: 110/76  Pulse: 87  Weight: 217 lb 9.6 oz (98.7 kg)   Body mass index is 29.51 kg/m. No exam data present  Patient is in no distress; well developed, nourished and groomed; neck is supple  CARDIOVASCULAR:  Examination of carotid arteries is normal; no carotid bruits  Regular rate and rhythm, no murmurs  Examination of peripheral vascular system by observation and palpation is normal  EYES:  Ophthalmoscopic exam of optic discs and posterior segments is normal; no papilledema or hemorrhages  MUSCULOSKELETAL:  Gait, strength, tone, movements noted in Neurologic  exam below  NEUROLOGIC: MENTAL STATUS:  No flowsheet data found.  awake, alert, oriented to person, place and time  recent and remote memory intact  normal attention and concentration  language fluent, comprehension intact, naming intact,   fund of knowledge appropriate  CRANIAL NERVE:   2nd - no papilledema on fundoscopic exam  2nd, 3rd, 4th, 6th - pupils equal and reactive to light, visual fields full to confrontation, extraocular muscles intact, no nystagmus  5th - facial sensation symmetric  7th - facial strength symmetric  8th - hearing intact  9th - palate elevates symmetrically, uvula midline  11th - shoulder shrug symmetric  12th - tongue protrusion midline  MOTOR:   normal  bulk and tone, full strength in the BUE, BLE  SENSORY:   normal and symmetric to light touch  COORDINATION:   finger-nose-finger, fine finger movements normal  REFLEXES:   deep tendon reflexes present and symmetric  GAIT/STATION:   narrow based gait; able to walk tandem    DIAGNOSTIC DATA (LABS, IMAGING, TESTING) - I reviewed patient records, labs, notes, testing and imaging myself where available.  No results found for: WBC, HGB, HCT, MCV, PLT No results found for: NA, K, CL, CO2, GLUCOSE, BUN, CREATININE, CALCIUM, PROT, ALBUMIN, AST, ALT, ALKPHOS, BILITOT, GFRNONAA, GFRAA No results found for: CHOL, HDL, LDLCALC, LDLDIRECT, TRIG, CHOLHDL No results found for: HGBA1C No results found for: VITAMINB12 No results found for: TSH   03/22/16 MRI brain [I reviewed images myself and agree with interpretation. -VRP]  - normal  04/07/16 EEG [I reviewed images myself and agree with interpretation. -VRP]  - normal  03/10/16 Carotid u/s - normal    ASSESSMENT AND PLAN  61 y.o. year old male here with new onset intermittent attacks of left upper extremity numbness and tingling, with slow progression from left shoulder to the left hand and fingers. The patient's description is  highly suspicious for jacksonian march and partial seizure events, but MRI brain and EEG were normal (including pt report of having 1 event during EEG). Left cervical radiculopathy or left arm peripheral neuropathy are other considerations but less likely given the repetitive, stereotypical nature of these episodes/attacks. MRI cervical was ordered but denied by insurance. Now symptoms more likely related to partial seizure cause.   Localization: right brain (parietal), left cervical nerve roots, left arm peripheral nerves  Ddx: partial seizure, left cervical radiculopathy, left arm peripheral neuropathy  1. Left arm numbness   2. Muscle twitching   3. Chronic left shoulder pain      PLAN: - start gabapentin 300mg  for empiric trial (may help with partial seizures; may help with peripheral nerve issues)  Meds ordered this encounter  Medications  . gabapentin (NEURONTIN) 300 MG capsule    Sig: Take 1-2 capsules (300-600 mg total) by mouth 2 (two) times daily.    Dispense:  120 capsule    Refill:  6   Return in about 3 months (around 10/31/2016).     Penni Bombard, MD [id number], [id number] PM Certified in Neurology, Neurophysiology and Neuroimaging  [facility] [address] [place], [place] [address] [phone]

## 2016-08-28 ENCOUNTER — Telehealth: Payer: Self-pay | Admitting: Diagnostic Neuroimaging

## 2016-08-28 NOTE — Telephone Encounter (Signed)
Pt called said he has increased gabapentin (NEURONTIN) 300 MG capsule 300mg  2am 2 pm for the past 2 weeks. Michela Pitcher he is having minimal symptoms in the shoulder and is wanting to know if Dr Mamie Nick would want to increase it again. Please call

## 2016-08-28 NOTE — Telephone Encounter (Signed)
Spoke with patient who stated he has been taking Gabapentin 600 mg twice daily x 2 weeks. He stated the intensity of his symptoms has decreased but he is still having symptoms daily.  He inquired about what he should do, if Dr Leta Baptist was going to increase his dose again. This RN advised he continue for one more week taking 600 mg twice daily and call next week with update. If at that time he has not had more relief, Dr Leta Baptist will need to send in new Rx for increased dosing. Patient verbalized understanding, appreciation for call.

## 2016-09-06 MED ORDER — GABAPENTIN 300 MG PO CAPS: 600.0000 mg | ORAL_CAPSULE | Freq: Three times a day (TID) | ORAL | 12 refills | Status: DC

## 2016-09-06 NOTE — Telephone Encounter (Signed)
Pt called said he is having some of the same symptoms. Please call

## 2016-09-06 NOTE — Telephone Encounter (Signed)
Spoke with patient and informed him a new RX has been sent. Advised he begin by increasing Gabapentin to 600 mg three x daily for 1-2 weeks before increasing it again. He verbalized understanding, appreciation.

## 2016-09-06 NOTE — Addendum Note (Signed)
Addended byAndrey Spearman on: 09/06/2016 04:48 PM   Modules accepted: Orders

## 2016-09-07 DIAGNOSIS — E7800 Pure hypercholesterolemia, unspecified: Secondary | ICD-10-CM | POA: Diagnosis not present

## 2016-09-07 DIAGNOSIS — R7303 Prediabetes: Secondary | ICD-10-CM | POA: Diagnosis not present

## 2016-09-07 DIAGNOSIS — I10 Essential (primary) hypertension: Secondary | ICD-10-CM | POA: Diagnosis not present

## 2016-09-28 DIAGNOSIS — M1611 Unilateral primary osteoarthritis, right hip: Secondary | ICD-10-CM | POA: Diagnosis not present

## 2016-10-10 DIAGNOSIS — M25562 Pain in left knee: Secondary | ICD-10-CM | POA: Diagnosis not present

## 2016-10-10 DIAGNOSIS — M1712 Unilateral primary osteoarthritis, left knee: Secondary | ICD-10-CM | POA: Diagnosis not present

## 2016-10-10 DIAGNOSIS — N401 Enlarged prostate with lower urinary tract symptoms: Secondary | ICD-10-CM | POA: Diagnosis not present

## 2016-10-30 DIAGNOSIS — H1032 Unspecified acute conjunctivitis, left eye: Secondary | ICD-10-CM | POA: Diagnosis not present

## 2016-10-31 ENCOUNTER — Ambulatory Visit (INDEPENDENT_AMBULATORY_CARE_PROVIDER_SITE_OTHER): Payer: 59 | Admitting: Diagnostic Neuroimaging

## 2016-10-31 ENCOUNTER — Encounter: Payer: Self-pay | Admitting: Diagnostic Neuroimaging

## 2016-10-31 VITALS — BP 113/80 | HR 75 | Ht 72.0 in | Wt 216.0 lb

## 2016-10-31 DIAGNOSIS — M25512 Pain in left shoulder: Secondary | ICD-10-CM

## 2016-10-31 DIAGNOSIS — R253 Fasciculation: Secondary | ICD-10-CM | POA: Diagnosis not present

## 2016-10-31 DIAGNOSIS — N401 Enlarged prostate with lower urinary tract symptoms: Secondary | ICD-10-CM | POA: Diagnosis not present

## 2016-10-31 DIAGNOSIS — R3912 Poor urinary stream: Secondary | ICD-10-CM | POA: Diagnosis not present

## 2016-10-31 DIAGNOSIS — R200 Anesthesia of skin: Secondary | ICD-10-CM

## 2016-10-31 DIAGNOSIS — G8929 Other chronic pain: Secondary | ICD-10-CM

## 2016-10-31 DIAGNOSIS — R350 Frequency of micturition: Secondary | ICD-10-CM | POA: Diagnosis not present

## 2016-10-31 MED ORDER — GABAPENTIN 300 MG PO CAPS: 900.0000 mg | ORAL_CAPSULE | Freq: Three times a day (TID) | ORAL | 12 refills | Status: DC

## 2016-10-31 NOTE — Patient Instructions (Signed)
-   continue gabapentin 900mg  three times per day

## 2016-10-31 NOTE — Progress Notes (Signed)
[facility]  PATIENT: Robert Carroll DOB: [birth year]  REFERRING CLINICIAN: K Supple HISTORY FROM: patient  REASON FOR VISIT: follow up    HISTORICAL  CHIEF COMPLAINT:  Chief Complaint  Patient presents with  . Follow-up  . L arm numbness    taking gabapentin 900mg  po tid (numbness not has frequent and less intense)    HISTORY OF PRESENT ILLNESS:   UPDATE 10/31/16: Since last visit, doing better on gabapentin 900mg  three times a day. Pain is much reduced. Attacks are milder and less frequent. Able to exercise better now.   UPDATE 08/02/16: Since last visit, was doing well until December 2017, then more intense symptoms and flare up. In the left arm there is the muscle tightness/numbness sensation. Sxs continue on daily basis; 2-4 per hour at least; lasting up to 1 minute at a time. Sometimes there is post-ictal weakness.   UPDATE 04/14/16: Since last visit, doing better now. Now going to [facility], using sauna, going to chiropractor. Symptoms are less frequent and less severe. Test results normal.   PRIOR HPI (03/03/16): 61 year old right-handed male here for evaluation of left upper extremity numbness and tingling. May 2017 patient had onset of left shoulder numbness and tingling sensation which would slowly moved down his arm into his fingers. The progression would slowly occur over 30-60 seconds and then resolve. No specific triggering factors. Patient was having multiple attacks per day, up to 3-4/hour. Initial attacks or sometimes associated with mild twitching of his left hand and fingers. Patient had some left axillary pain sensation, history of right shoulder problem, and therefore followed up with his orthopedic surgeon. Patient was evaluated for possible impingement syndrome of the left shoulder and treated with trigger point injection. Patient referred to me for evaluation and consideration of alternate explanation of the symptoms. Over time the attacks have  continued although slightly decreased in intensity and frequency. Patient denies any specific neck pain. No prodromal injuries, accidents, traumas. No history of seizure, TIA or stroke. No family history of seizure. Patient denies any left face or left leg numbness or tingling. No loss of consciousness or altered consciousness attacks.   REVIEW OF SYSTEMS: Full 14 system review of systems performed and negative with exception of: joint pain numbness   ALLERGIES: No Known Allergies  HOME MEDICATIONS: Outpatient Medications Prior to Visit  Medication Sig Dispense Refill  . amLODipine-benazepril (LOTREL) 5-20 MG per capsule Take 1 capsule by mouth daily.     Marland Kitchen aspirin EC 81 MG tablet Take 1 tablet (81 mg total) by mouth daily.    Marland Kitchen atorvastatin (LIPITOR) 20 MG tablet 20 mg daily.    . cabergoline (DOSTINEX) 0.5 MG tablet 0.5 mg. Twice a week    . doxazosin (CARDURA) 4 MG tablet Take 4 mg by mouth at bedtime.     . Flax Oil-Fish Oil-Borage Oil (FISH OIL-FLAX OIL-BORAGE OIL PO) Take by mouth daily.    Marland Kitchen gabapentin (NEURONTIN) 300 MG capsule Take 2-3 capsules (600-900 mg total) by mouth 3 (three) times daily. 270 capsule 12  . glucosamine-chondroitin 500-400 MG tablet Take 1 tablet by mouth daily.    . meloxicam (MOBIC) 15 MG tablet 15 mg daily.    . Misc Natural Products (COLON CLEANSER PO) Take by mouth as needed.    . Multiple Vitamins-Minerals (MULTIVITAMIN PO) Take by mouth.    Marland Kitchen omeprazole (PRILOSEC) 20 MG capsule Take 20 mg by mouth daily.     . valACYclovir (VALTREX) 1000 MG tablet Take 500  mg by mouth daily.     Marland Kitchen ALPRAZolam (XANAX) 0.5 MG tablet Take 1 tablet (0.5 mg total) by mouth as needed for anxiety (for sedation before MRI scan; take 1 hour before scan; may repeat 15 min before scan). 3 tablet 0  . CIALIS 20 MG tablet Take 20 mg by mouth daily.      Facility-Administered Medications Prior to Visit  Medication Dose Route Frequency Provider Last Rate Last Dose  . gadopentetate  dimeglumine (MAGNEVIST) injection 20 mL  20 mL Intravenous Once PRN Penni Bombard, MD        PAST MEDICAL HISTORY: Past Medical History:  Diagnosis Date  . Colon polyp    adenomatous, 2008  . Genital herpes   . Hyperlipidemia   . Hypertension     PAST SURGICAL HISTORY: Past Surgical History:  Procedure Laterality Date  . SHOULDER SURGERY  1974, 1977   right    FAMILY HISTORY: Family History  Problem Relation Age of Onset  . Heart disease Mother   . Stroke Mother   . Heart disease Father   . Hypertension Father   . Diabetes Father   . Heart disease Sister   . Lupus Sister   . Heart disease Brother   . Hypertension Brother   . Diabetes Brother   . Heart disease Maternal Aunt   . Heart disease Maternal Uncle   . Cancer Maternal Uncle     prostate  . Cancer Paternal Uncle     prostate  . Colon cancer Neg Hx   . Stomach cancer Neg Hx     SOCIAL HISTORY:  Social History   Social History  . Marital status: Married    Spouse name: N/A  . Number of children: 3  . Years of education: 51   Occupational History  .      [employer], administration   Social History Main Topics  . Smoking status: Never Smoker  . Smokeless tobacco: Never Used  . Alcohol use 2.0 oz/week    4 Standard drinks or equivalent per week     Comment: 3-4 weekly  . Drug use: No  . Sexual activity: Not on file   Other Topics Concern  . Not on file   Social History Narrative  . No narrative on file     PHYSICAL EXAM  GENERAL EXAM/CONSTITUTIONAL: Vitals:  Vitals:   10/31/16 1128  BP: 113/80  Pulse: 75  Weight: 216 lb (98 kg)  Height: 6' (1.829 m)   Body mass index is 29.29 kg/m. No exam data present  Patient is in no distress; well developed, nourished and groomed; neck is supple  CARDIOVASCULAR:  Examination of carotid arteries is normal; no carotid bruits  Regular rate and rhythm, no murmurs  Examination of peripheral vascular system by observation  and palpation is normal  EYES:  Ophthalmoscopic exam of optic discs and posterior segments is normal; no papilledema or hemorrhages  MUSCULOSKELETAL:  Gait, strength, tone, movements noted in Neurologic exam below  NEUROLOGIC: MENTAL STATUS:  No flowsheet data found.  awake, alert, oriented to person, place and time  recent and remote memory intact  normal attention and concentration  language fluent, comprehension intact, naming intact,   fund of knowledge appropriate  CRANIAL NERVE:   2nd - no papilledema on fundoscopic exam  2nd, 3rd, 4th, 6th - pupils equal and reactive to light, visual fields full to confrontation, extraocular muscles intact, no nystagmus  5th - facial sensation symmetric  7th -  facial strength symmetric  8th - hearing intact  9th - palate elevates symmetrically, uvula midline  11th - shoulder shrug symmetric  12th - tongue protrusion midline  MOTOR:   normal bulk and tone, full strength in the BUE, BLE  SENSORY:   normal and symmetric to light touch  COORDINATION:   finger-nose-finger, fine finger movements normal  REFLEXES:   deep tendon reflexes present and symmetric  GAIT/STATION:   narrow based gait    DIAGNOSTIC DATA (LABS, IMAGING, TESTING) - I reviewed patient records, labs, notes, testing and imaging myself where available.  No results found for: WBC, HGB, HCT, MCV, PLT No results found for: NA, K, CL, CO2, GLUCOSE, BUN, CREATININE, CALCIUM, PROT, ALBUMIN, AST, ALT, ALKPHOS, BILITOT, GFRNONAA, GFRAA No results found for: CHOL, HDL, LDLCALC, LDLDIRECT, TRIG, CHOLHDL No results found for: HGBA1C No results found for: VITAMINB12 No results found for: TSH   03/22/16 MRI brain [I reviewed images myself and agree with interpretation. -VRP]  - normal  04/07/16 EEG [I reviewed images myself and agree with interpretation. -VRP]  - normal  03/10/16 Carotid u/s - normal    ASSESSMENT AND PLAN  61 y.o. year old  male here with new onset intermittent attacks of left upper extremity numbness and tingling, with slow progression from left shoulder to the left hand and fingers. The patient's description is highly suspicious for jacksonian march and partial seizure events, but MRI brain and EEG were normal (including pt report of having 1 event during EEG). Left cervical radiculopathy or left arm peripheral neuropathy are other considerations but less likely given the repetitive, stereotypical nature of these episodes/attacks. MRI cervical was ordered but denied by insurance. Now symptoms more likely related to partial seizure cause.   Localization: right brain (parietal), left cervical nerve roots, left arm peripheral nerves  Ddx: partial seizure, left cervical radiculopathy, left arm peripheral neuropathy  1. Left arm numbness   2. Muscle twitching   3. Chronic left shoulder pain      PLAN: - continue gabapentin 900mg  three times per day (may be helping with partial seizures; may be help with peripheral nerve issues) - if PCP can refill gabapentin, then patient may follow up as needed with [place]  Meds ordered this encounter  Medications  . gabapentin (NEURONTIN) 300 MG capsule    Sig: Take 3 capsules (900 mg total) by mouth 3 (three) times daily.    Dispense:  270 capsule    Refill:  12   Return in about 1 year (around 10/31/2017).     Penni Bombard, MD 1/61/0960, 45:40 PM Certified in Neurology, Neurophysiology and Neuroimaging  [facility] [address] [place], [place] [address] [phone]

## 2016-11-06 DIAGNOSIS — H40013 Open angle with borderline findings, low risk, bilateral: Secondary | ICD-10-CM | POA: Diagnosis not present

## 2016-11-22 DIAGNOSIS — M1712 Unilateral primary osteoarthritis, left knee: Secondary | ICD-10-CM | POA: Diagnosis not present

## 2016-11-22 DIAGNOSIS — M25562 Pain in left knee: Secondary | ICD-10-CM | POA: Diagnosis not present

## 2016-11-23 DIAGNOSIS — M1612 Unilateral primary osteoarthritis, left hip: Secondary | ICD-10-CM | POA: Diagnosis not present

## 2016-11-30 DIAGNOSIS — M1712 Unilateral primary osteoarthritis, left knee: Secondary | ICD-10-CM | POA: Diagnosis not present

## 2016-12-07 DIAGNOSIS — M1712 Unilateral primary osteoarthritis, left knee: Secondary | ICD-10-CM | POA: Diagnosis not present

## 2016-12-07 DIAGNOSIS — M25562 Pain in left knee: Secondary | ICD-10-CM | POA: Diagnosis not present

## 2016-12-11 DIAGNOSIS — H6123 Impacted cerumen, bilateral: Secondary | ICD-10-CM | POA: Diagnosis not present

## 2016-12-13 NOTE — H&P (Signed)
TOTAL HIP ADMISSION H&P  Patient is admitted for right total hip arthroplasty, anterior approach.  Subjective:  Chief Complaint:      Right hip primary OA / pain  HPI: Robert Carroll, 61 y.o. male, has a history of pain and functional disability in the right hip(s) due to arthritis and patient has failed non-surgical conservative treatments for greater than 12 weeks to include NSAID's and/or analgesics, corticosteriod injections and activity modification.  Onset of symptoms was gradual starting ~10 years ago with gradually worsening course since that time.The patient noted no past surgery on the right hip(s).  Patient currently rates pain in the right hip at 8 out of 10 with activity. Patient has night pain, worsening of pain with activity and weight bearing, trendelenberg gait, pain that interfers with activities of daily living and pain with passive range of motion. Patient has evidence of periarticular osteophytes and joint space narrowing by imaging studies. This condition presents safety issues increasing the risk of falls.   There is no current active infection.   Risks, benefits and expectations were discussed with the patient.  Risks including but not limited to the risk of anesthesia, blood clots, nerve damage, blood vessel damage, failure of the prosthesis, infection and up to and including death.  Patient understand the risks, benefits and expectations and wishes to proceed with surgery.   PCP: Antony Contras, MD  D/C Plans:       Home   Post-op Meds:       No Rx given  Tranexamic Acid:      To be given - IV  Decadron:      Is to be given  FYI:     ASA  Norco  DME:   Rx mailed to the patient   PT:   No PT     Past Medical History:  Diagnosis Date  . Colon polyp    adenomatous, 2008  . Genital herpes   . Hyperlipidemia   . Hypertension     Past Surgical History:  Procedure Laterality Date  . SHOULDER SURGERY  1974, 1977   right    No prescriptions prior to admission.    No Known Allergies   Social History  Substance Use Topics  . Smoking status: Never Smoker  . Smokeless tobacco: Never Used  . Alcohol use 2.0 oz/week    4 Standard drinks or equivalent per week     Comment: 3-4 weekly    Family History  Problem Relation Age of Onset  . Heart disease Mother   . Stroke Mother   . Heart disease Father   . Hypertension Father   . Diabetes Father   . Heart disease Sister   . Lupus Sister   . Heart disease Brother   . Hypertension Brother   . Diabetes Brother   . Heart disease Maternal Aunt   . Heart disease Maternal Uncle   . Cancer Maternal Uncle        prostate  . Cancer Paternal Uncle        prostate  . Colon cancer Neg Hx   . Stomach cancer Neg Hx      Review of Systems  Constitutional: Negative.   HENT: Negative.   Eyes: Negative.   Respiratory: Negative.   Cardiovascular: Negative.   Gastrointestinal: Negative.   Genitourinary: Negative.   Musculoskeletal: Positive for joint pain.  Skin: Negative.   Neurological: Negative.   Endo/Heme/Allergies: Negative.   Psychiatric/Behavioral: Negative.     Objective:  Physical  Exam  Constitutional: He is oriented to person, place, and time. He appears well-developed.  HENT:  Head: Normocephalic.  Eyes: Pupils are equal, round, and reactive to light.  Neck: Neck supple. No JVD present. No tracheal deviation present. No thyromegaly present.  Cardiovascular: Normal rate, regular rhythm and intact distal pulses.   Respiratory: Effort normal and breath sounds normal. No respiratory distress. He has no wheezes.  GI: Soft. There is no tenderness. There is no guarding.  Musculoskeletal:       Right hip: He exhibits decreased range of motion, decreased strength, tenderness and bony tenderness. He exhibits no swelling, no deformity and no laceration.  Lymphadenopathy:    He has no cervical adenopathy.  Neurological: He is alert and oriented to person, place, and time.  Skin: Skin is warm  and dry.  Psychiatric: He has a normal mood and affect.      Labs:  Estimated body mass index is 29.29 kg/m as calculated from the following:   Height as of 10/31/16: 6' (1.829 m).   Weight as of 10/31/16: 98 kg (216 lb).   Imaging Review Plain radiographs demonstrate severe degenerative joint disease of the right hip(s). The bone quality appears to be good for age and reported activity level.  Assessment/Plan:  End stage arthritis, right hip(s)  The patient history, physical examination, clinical judgement of the provider and imaging studies are consistent with end stage degenerative joint disease of the right hip(s) and total hip arthroplasty is deemed medically necessary. The treatment options including medical management, injection therapy, arthroscopy and arthroplasty were discussed at length. The risks and benefits of total hip arthroplasty were presented and reviewed. The risks due to aseptic loosening, infection, stiffness, dislocation/subluxation,  thromboembolic complications and other imponderables were discussed.  The patient acknowledged the explanation, agreed to proceed with the plan and consent was signed. Patient is being admitted for inpatient treatment for surgery, pain control, PT, OT, prophylactic antibiotics, VTE prophylaxis, progressive ambulation and ADL's and discharge planning.The patient is planning to be discharged home.      West Pugh Eh Sauseda   PA-C  12/13/2016, 2:03 PM

## 2016-12-14 ENCOUNTER — Other Ambulatory Visit (HOSPITAL_COMMUNITY): Payer: Self-pay | Admitting: Emergency Medicine

## 2016-12-14 NOTE — Patient Instructions (Signed)
Shreyas Piatkowski  12/14/2016   Your procedure is scheduled on: 11-25-16  Report to [facility] Main  Entrance  Follow signs to Short Stay on first floor at [facility]   Call this number if you have problems the morning of surgery [phone]   Remember: ONLY 1 PERSON MAY GO WITH YOU TO SHORT STAY TO GET  READY MORNING OF [place].  Do not eat food or drink liquids :After Midnight.     Take these medicines the morning of surgery with A SIP OF WATER: cabergoline(dostinex), gabapentin, loratadine, vacyclovir                                 You may not have any metal on your body including hair pins and              piercings  Do not wear jewelry, make-up, lotions, powders or perfumes, deodorant              Men may shave face and neck.   Do not bring valuables to the hospital. [place].  Contacts, dentures or bridgework may not be worn into surgery.  Leave suitcase in the car. After surgery it may be brought to your room.               Please read over the following fact sheets you were given: _____________________________________________________________________   [facility] - Preparing for Surgery Before surgery, you can play an important role.  Because skin is not sterile, your skin needs to be as free of germs as possible.  You can reduce the number of germs on your skin by washing with CHG (chlorahexidine gluconate) soap before surgery.  CHG is an antiseptic cleaner which kills germs and bonds with the skin to continue killing germs even after washing. Please DO NOT use if you have an allergy to CHG or antibacterial soaps.  If your skin becomes reddened/irritated stop using the CHG and inform your nurse when you arrive at Short Stay. Do not shave (including legs and underarms) for at least 48 hours prior to the first CHG shower.  You may shave your face/neck. Please follow these instructions carefully:  1.   Shower with CHG Soap the night before surgery and the  morning of Surgery.  2.  If you choose to wash your hair, wash your hair first as usual with your  normal  shampoo.  3.  After you shampoo, rinse your hair and body thoroughly to remove the  shampoo.                           4.  Use CHG as you would any other liquid soap.  You can apply chg directly  to the skin and wash                       Gently with a scrungie or clean washcloth.  5.  Apply the CHG Soap to your body ONLY FROM THE NECK DOWN.   Do not use on face/ open  Wound or open sores. Avoid contact with eyes, ears mouth and genitals (private parts).                       Wash face,  Genitals (private parts) with your normal soap.             6.  Wash thoroughly, paying special attention to the area where your surgery  will be performed.  7.  Thoroughly rinse your body with warm water from the neck down.  8.  DO NOT shower/wash with your normal soap after using and rinsing off  the CHG Soap.                9.  Pat yourself dry with a clean towel.            10.  Wear clean pajamas.            11.  Place clean sheets on your bed the night of your first shower and do not  sleep with pets. Day of Surgery : Do not apply any lotions/deodorants the morning of surgery.  Please wear clean clothes to the hospital/surgery center.  FAILURE TO FOLLOW THESE INSTRUCTIONS MAY RESULT IN THE CANCELLATION OF YOUR SURGERY PATIENT SIGNATURE_________________________________  NURSE SIGNATURE__________________________________  ________________________________________________________________________   [name]  An incentive spirometer is a tool that can help keep your lungs clear and active. This tool measures how well you are filling your lungs with each breath. Taking long deep breaths may help reverse or decrease the chance of developing breathing (pulmonary) problems (especially infection) following:  A long  period of time when you are unable to move or be active. BEFORE THE PROCEDURE   If the spirometer includes an indicator to show your best effort, your nurse or respiratory therapist will set it to a desired goal.  If possible, sit up straight or lean slightly forward. Try not to slouch.  Hold the incentive spirometer in an upright position. INSTRUCTIONS FOR USE  1. Sit on the edge of your bed if possible, or sit up as far as you can in bed or on a chair. 2. Hold the incentive spirometer in an upright position. 3. Breathe out normally. 4. Place the mouthpiece in your mouth and seal your lips tightly around it. 5. Breathe in slowly and as deeply as possible, raising the piston or the ball toward the top of the column. 6. Hold your breath for 3-5 seconds or for as long as possible. Allow the piston or ball to fall to the bottom of the column. 7. Remove the mouthpiece from your mouth and breathe out normally. 8. Rest for a few seconds and repeat Steps 1 through 7 at least 10 times every 1-2 hours when you are awake. Take your time and take a few normal breaths between deep breaths. 9. The spirometer may include an indicator to show your best effort. Use the indicator as a goal to work toward during each repetition. 10. After each set of 10 deep breaths, practice coughing to be sure your lungs are clear. If you have an incision (the cut made at the time of surgery), support your incision when coughing by placing a pillow or rolled up towels firmly against it. Once you are able to get out of bed, walk around indoors and cough well. You may stop using the incentive spirometer when instructed by your caregiver.  RISKS AND COMPLICATIONS  Take your time so you do not get  dizzy or light-headed.  If you are in pain, you may need to take or ask for pain medication before doing incentive spirometry. It is harder to take a deep breath if you are having pain. AFTER USE  Rest and breathe slowly and  easily.  It can be helpful to keep track of a log of your progress. Your caregiver can provide you with a simple table to help with this. If you are using the spirometer at home, follow these instructions: [place] IF:   You are having difficultly using the spirometer.  You have trouble using the spirometer as often as instructed.  Your pain medication is not giving enough relief while using the spirometer.  You develop fever of 100.5 F (38.1 C) or higher. SEEK IMMEDIATE MEDICAL CARE IF:   You cough up bloody sputum that had not been present before.  You develop fever of 102 F (38.9 C) or greater.  You develop worsening pain at or near the incision site. MAKE SURE YOU:   Understand these instructions.  Will watch your condition.  Will get help right away if you are not doing well or get worse. Document Released: 11/20/2006 Document Revised: 10/02/2011 Document Reviewed: 01/21/2007 ExitCare Patient Information 2014 Exit[employer], [place].   ________________________________________________________________________  WHAT IS A BLOOD TRANSFUSION? Blood Transfusion Information  A transfusion is the replacement of blood or some of its parts. Blood is made up of multiple cells which provide different functions.  Red blood cells carry oxygen and are used for blood loss replacement.  White blood cells fight against infection.  Platelets control bleeding.  Plasma helps clot blood.  Other blood products are available for specialized needs, such as hemophilia or other clotting disorders. BEFORE THE TRANSFUSION  Who gives blood for transfusions?   Healthy volunteers who are fully evaluated to make sure their blood is safe. This is blood bank blood. Transfusion therapy is the safest it has ever been in the practice of medicine. Before blood is taken from a donor, a complete history is taken to make sure that person has no history of diseases nor engages in risky social  behavior (examples are intravenous drug use or sexual activity with multiple partners). The donor's travel history is screened to minimize risk of transmitting infections, such as malaria. The donated blood is tested for signs of infectious diseases, such as HIV and hepatitis. The blood is then tested to be sure it is compatible with you in order to minimize the chance of a transfusion reaction. If you or a relative donates blood, this is often done in anticipation of surgery and is not appropriate for emergency situations. It takes many days to process the donated blood. RISKS AND COMPLICATIONS Although transfusion therapy is very safe and saves many lives, the main dangers of transfusion include:   Getting an infectious disease.  Developing a transfusion reaction. This is an allergic reaction to something in the blood you were given. Every precaution is taken to prevent this. The decision to have a blood transfusion has been considered carefully by your caregiver before blood is given. Blood is not given unless the benefits outweigh the risks. AFTER THE TRANSFUSION  Right after receiving a blood transfusion, you will usually feel much better and more energetic. This is especially true if your red blood cells have gotten low (anemic). The transfusion raises the level of the red blood cells which carry oxygen, and this usually causes an energy increase.  The nurse administering the transfusion will  monitor you carefully for complications. HOME CARE INSTRUCTIONS  No special instructions are needed after a transfusion. You may find your energy is better. Speak with your caregiver about any limitations on activity for underlying diseases you may have. SEEK MEDICAL CARE IF:   Your condition is not improving after your transfusion.  You develop redness or irritation at the intravenous (IV) site. SEEK IMMEDIATE MEDICAL CARE IF:  Any of the following symptoms occur over the next 12 hours:  Shaking  chills.  You have a temperature by mouth above 102 F (38.9 C), not controlled by medicine.  Chest, back, or muscle pain.  People around you feel you are not acting correctly or are confused.  Shortness of breath or difficulty breathing.  Dizziness and fainting.  You get a rash or develop hives.  You have a decrease in urine output.  Your urine turns a dark color or changes to pink, red, or brown. Any of the following symptoms occur over the next 10 days:  You have a temperature by mouth above 102 F (38.9 C), not controlled by medicine.  Shortness of breath.  Weakness after normal activity.  The white part of the eye turns yellow (jaundice).  You have a decrease in the amount of urine or are urinating less often.  Your urine turns a dark color or changes to pink, red, or brown. Document Released: 07/07/2000 Document Revised: 10/02/2011 Document Reviewed: 02/24/2008 [facility] Patient Information 2014 [place], [place].  _______________________________________________________________________

## 2016-12-15 ENCOUNTER — Encounter: Payer: Self-pay | Admitting: Gastroenterology

## 2016-12-19 ENCOUNTER — Encounter (HOSPITAL_COMMUNITY): Payer: Self-pay

## 2016-12-19 ENCOUNTER — Encounter (HOSPITAL_COMMUNITY)
Admission: RE | Admit: 2016-12-19 | Discharge: 2016-12-19 | Disposition: A | Discharge status: Home / Self Care | Payer: 59 | Source: Ambulatory Visit | Attending: Orthopedic Surgery | Admitting: Orthopedic Surgery

## 2016-12-19 DIAGNOSIS — I10 Essential (primary) hypertension: Secondary | ICD-10-CM | POA: Insufficient documentation

## 2016-12-19 DIAGNOSIS — Z01812 Encounter for preprocedural laboratory examination: Secondary | ICD-10-CM | POA: Diagnosis not present

## 2016-12-19 DIAGNOSIS — Z01810 Encounter for preprocedural cardiovascular examination: Secondary | ICD-10-CM | POA: Diagnosis not present

## 2016-12-19 LAB — BASIC METABOLIC PANEL
Anion gap: 8 (ref 5–15)
BUN: 20 mg/dL (ref 6–20)
CHLORIDE: 109 mmol/L (ref 101–111)
CO2: 24 mmol/L (ref 22–32)
CREATININE: 0.96 mg/dL (ref 0.61–1.24)
Calcium: 9.2 mg/dL (ref 8.9–10.3)
GFR calc Af Amer: >60 mL/min (ref >60)
GFR calc non Af Amer: >60 mL/min (ref >60)
Glucose, Bld: 96 mg/dL (ref 65–99)
Potassium: 3.9 mmol/L (ref 3.5–5.1)
SODIUM: 141 mmol/L (ref 135–145)

## 2016-12-19 LAB — ABO/RH: ABO/RH(D): A POS

## 2016-12-19 LAB — CBC
HCT: 40 % (ref 39.0–52.0)
Hemoglobin: 13.5 g/dL (ref 13.0–17.0)
MCH: 30.8 pg (ref 26.0–34.0)
MCHC: 33.8 g/dL (ref 30.0–36.0)
MCV: 91.3 fL (ref 78.0–100.0)
PLATELETS: 142 10*3/uL — AB (ref 150–400)
RBC: 4.38 MIL/uL (ref 4.22–5.81)
RDW: 13.3 % (ref 11.5–15.5)
WBC: 3.4 10*3/uL — ABNORMAL LOW (ref 4.0–10.5)

## 2016-12-19 LAB — SURGICAL PCR SCREEN
MRSA, PCR: NEGATIVE
Staphylococcus aureus: POSITIVE — AB

## 2016-12-25 NOTE — Anesthesia Preprocedure Evaluation (Signed)
Anesthesia Evaluation  Patient identified by MRN, date of birth, ID band Patient awake    Reviewed: Allergy & Precautions, NPO status , Patient's Chart, lab work & pertinent test results  Airway Mallampati: II  TM Distance: >3 FB Neck ROM: Full    Dental no notable dental hx.    Pulmonary neg pulmonary ROS,    Pulmonary exam normal breath sounds clear to auscultation       Cardiovascular hypertension, Normal cardiovascular exam Rhythm:Regular Rate:Normal     Neuro/Psych negative neurological ROS  negative psych ROS   GI/Hepatic negative GI ROS, Neg liver ROS,   Endo/Other  negative endocrine ROS  Renal/GU negative Renal ROS  negative genitourinary   Musculoskeletal negative musculoskeletal ROS (+)   Abdominal   Peds negative pediatric ROS (+)  Hematology negative hematology ROS (+)   Anesthesia Other Findings   Reproductive/Obstetrics negative OB ROS                             Anesthesia Physical Anesthesia Plan  ASA: II  Anesthesia Plan: Spinal   Post-op Pain Management:    Induction: Intravenous  Airway Management Planned: Simple Face Mask  Additional Equipment:   Intra-op Plan:   Post-operative Plan:   Informed Consent: I have reviewed the patients History and Physical, chart, labs and discussed the procedure including the risks, benefits and alternatives for the proposed anesthesia with the patient or authorized representative who has indicated his/her understanding and acceptance.   Dental advisory given  Plan Discussed with: CRNA and Surgeon  Anesthesia Plan Comments:         Anesthesia Quick Evaluation

## 2016-12-26 ENCOUNTER — Encounter (HOSPITAL_COMMUNITY)
Admission: RE | Disposition: A | Discharge status: Home / Self Care | Payer: Self-pay | Source: Ambulatory Visit | Attending: Orthopedic Surgery

## 2016-12-26 ENCOUNTER — Encounter (HOSPITAL_COMMUNITY): Payer: Self-pay | Admitting: *Deleted

## 2016-12-26 ENCOUNTER — Inpatient Hospital Stay (HOSPITAL_COMMUNITY): Payer: 59

## 2016-12-26 ENCOUNTER — Inpatient Hospital Stay (HOSPITAL_COMMUNITY): Payer: 59 | Admitting: Anesthesiology

## 2016-12-26 ENCOUNTER — Inpatient Hospital Stay (HOSPITAL_COMMUNITY)
Admission: RE | Admit: 2016-12-26 | Discharge: 2016-12-27 | DRG: 470 | Disposition: A | Discharge status: Home / Self Care | Payer: 59 | Source: Ambulatory Visit | Attending: Orthopedic Surgery | Admitting: Orthopedic Surgery

## 2016-12-26 DIAGNOSIS — Z79899 Other long term (current) drug therapy: Secondary | ICD-10-CM

## 2016-12-26 DIAGNOSIS — M1611 Unilateral primary osteoarthritis, right hip: Secondary | ICD-10-CM | POA: Diagnosis not present

## 2016-12-26 DIAGNOSIS — Z6825 Body mass index (BMI) 25.0-25.9, adult: Secondary | ICD-10-CM | POA: Diagnosis not present

## 2016-12-26 DIAGNOSIS — M25551 Pain in right hip: Secondary | ICD-10-CM | POA: Diagnosis not present

## 2016-12-26 DIAGNOSIS — E785 Hyperlipidemia, unspecified: Secondary | ICD-10-CM | POA: Diagnosis present

## 2016-12-26 DIAGNOSIS — I10 Essential (primary) hypertension: Secondary | ICD-10-CM | POA: Diagnosis present

## 2016-12-26 DIAGNOSIS — E663 Overweight: Secondary | ICD-10-CM | POA: Diagnosis present

## 2016-12-26 DIAGNOSIS — Z7982 Long term (current) use of aspirin: Secondary | ICD-10-CM

## 2016-12-26 DIAGNOSIS — Z96641 Presence of right artificial hip joint: Secondary | ICD-10-CM

## 2016-12-26 DIAGNOSIS — Z96649 Presence of unspecified artificial hip joint: Secondary | ICD-10-CM

## 2016-12-26 HISTORY — PX: TOTAL HIP ARTHROPLASTY: SHX124

## 2016-12-26 LAB — TYPE AND SCREEN
ABO/RH(D): A POS
Antibody Screen: NEGATIVE

## 2016-12-26 SURGERY — ARTHROPLASTY, HIP, TOTAL, ANTERIOR APPROACH
Anesthesia: Spinal | Site: Hip | Laterality: Right

## 2016-12-26 MED ORDER — STERILE WATER FOR IRRIGATION IR SOLN
Status: DC | PRN
  Administered 2016-12-26: 2000 mL

## 2016-12-26 MED ORDER — ONDANSETRON HCL 4 MG/2ML IJ SOLN
INTRAMUSCULAR | Status: DC | PRN
  Administered 2016-12-26: 4 mg via INTRAVENOUS

## 2016-12-26 MED ORDER — EPHEDRINE 5 MG/ML INJ
INTRAVENOUS | Status: AC
  Filled 2016-12-26: qty 10

## 2016-12-26 MED ORDER — FLUTICASONE PROPIONATE 50 MCG/ACT NA SUSP: 1.0000 | Freq: Every day | NASAL | Status: DC | PRN

## 2016-12-26 MED ORDER — METHOCARBAMOL 500 MG PO TABS
500.0000 mg | ORAL_TABLET | Freq: Four times a day (QID) | ORAL | Status: DC | PRN
  Administered 2016-12-26 – 2016-12-27 (×3): 500 mg via ORAL
  Filled 2016-12-26 (×3): qty 1

## 2016-12-26 MED ORDER — FENTANYL CITRATE (PF) 100 MCG/2ML IJ SOLN
INTRAMUSCULAR | Status: DC | PRN
  Administered 2016-12-26: 100 ug via INTRAVENOUS

## 2016-12-26 MED ORDER — ACETAMINOPHEN 325 MG PO TABS: 650.0000 mg | ORAL_TABLET | ORAL | Status: DC | PRN

## 2016-12-26 MED ORDER — HYDROMORPHONE HCL 1 MG/ML IJ SOLN
INTRAMUSCULAR | Status: AC
  Filled 2016-12-26: qty 1

## 2016-12-26 MED ORDER — METOCLOPRAMIDE HCL 5 MG/ML IJ SOLN
5.0000 mg | Freq: Three times a day (TID) | INTRAMUSCULAR | Status: DC | PRN
  Administered 2016-12-26: 10 mg via INTRAVENOUS
  Filled 2016-12-26: qty 2

## 2016-12-26 MED ORDER — LORATADINE 10 MG PO TABS
10.0000 mg | ORAL_TABLET | Freq: Every day | ORAL | Status: DC
  Administered 2016-12-27: 10 mg via ORAL
  Filled 2016-12-26: qty 1

## 2016-12-26 MED ORDER — ASPIRIN 81 MG PO CHEW: 81.0000 mg | CHEWABLE_TABLET | Freq: Two times a day (BID) | ORAL | 0 refills | Status: AC

## 2016-12-26 MED ORDER — LACTATED RINGERS IV SOLN
INTRAVENOUS | Status: DC
  Administered 2016-12-26 (×2): via INTRAVENOUS

## 2016-12-26 MED ORDER — FERROUS SULFATE 325 (65 FE) MG PO TABS: 325.0000 mg | ORAL_TABLET | Freq: Three times a day (TID) | ORAL | Status: DC

## 2016-12-26 MED ORDER — VALACYCLOVIR HCL 500 MG PO TABS
500.0000 mg | ORAL_TABLET | Freq: Every day | ORAL | Status: DC
  Administered 2016-12-27: 500 mg via ORAL
  Filled 2016-12-26: qty 1

## 2016-12-26 MED ORDER — DOCUSATE SODIUM 100 MG PO CAPS: 100.0000 mg | ORAL_CAPSULE | Freq: Two times a day (BID) | ORAL | 0 refills | Status: DC

## 2016-12-26 MED ORDER — FENTANYL CITRATE (PF) 100 MCG/2ML IJ SOLN
INTRAMUSCULAR | Status: AC
  Filled 2016-12-26: qty 2

## 2016-12-26 MED ORDER — GABAPENTIN 300 MG PO CAPS
900.0000 mg | ORAL_CAPSULE | Freq: Three times a day (TID) | ORAL | Status: DC
  Administered 2016-12-26 – 2016-12-27 (×3): 900 mg via ORAL
  Filled 2016-12-26 (×3): qty 3

## 2016-12-26 MED ORDER — SUCCINYLCHOLINE CHLORIDE 200 MG/10ML IV SOSY
PREFILLED_SYRINGE | INTRAVENOUS | Status: AC
  Filled 2016-12-26: qty 10

## 2016-12-26 MED ORDER — ONDANSETRON HCL 4 MG PO TABS: 4.0000 mg | ORAL_TABLET | Freq: Four times a day (QID) | ORAL | Status: DC | PRN

## 2016-12-26 MED ORDER — MAGNESIUM CITRATE PO SOLN: 1.0000 | Freq: Once | ORAL | Status: DC | PRN

## 2016-12-26 MED ORDER — PROMETHAZINE HCL 25 MG/ML IJ SOLN: 6.2500 mg | INTRAMUSCULAR | Status: DC | PRN

## 2016-12-26 MED ORDER — FENTANYL CITRATE (PF) 100 MCG/2ML IJ SOLN
25.0000 ug | INTRAMUSCULAR | Status: DC | PRN
  Administered 2016-12-26 (×2): 50 ug via INTRAVENOUS
  Filled 2016-12-26 (×2): qty 2

## 2016-12-26 MED ORDER — POLYETHYLENE GLYCOL 3350 17 G PO PACK: 17.0000 g | PACK | Freq: Two times a day (BID) | ORAL | 0 refills | Status: DC

## 2016-12-26 MED ORDER — ONDANSETRON HCL 4 MG/2ML IJ SOLN
4.0000 mg | Freq: Four times a day (QID) | INTRAMUSCULAR | Status: DC | PRN
  Administered 2016-12-26: 4 mg via INTRAVENOUS
  Filled 2016-12-26: qty 2

## 2016-12-26 MED ORDER — OXYCODONE HCL 5 MG PO TABS
5.0000 mg | ORAL_TABLET | ORAL | Status: DC | PRN
  Administered 2016-12-26 (×2): 5 mg via ORAL
  Administered 2016-12-27 (×2): 10 mg via ORAL
  Filled 2016-12-26: qty 2
  Filled 2016-12-26 (×2): qty 1
  Filled 2016-12-26: qty 2

## 2016-12-26 MED ORDER — BUPIVACAINE IN DEXTROSE 0.75-8.25 % IT SOLN
INTRATHECAL | Status: DC | PRN
  Administered 2016-12-26: 2 mL via INTRATHECAL

## 2016-12-26 MED ORDER — METHOCARBAMOL 500 MG PO TABS: 500.0000 mg | ORAL_TABLET | Freq: Four times a day (QID) | ORAL | 0 refills | Status: DC | PRN

## 2016-12-26 MED ORDER — METOCLOPRAMIDE HCL 5 MG PO TABS: 5.0000 mg | ORAL_TABLET | Freq: Three times a day (TID) | ORAL | Status: DC | PRN

## 2016-12-26 MED ORDER — CHLORHEXIDINE GLUCONATE 4 % EX LIQD: 60.0000 mL | Freq: Once | CUTANEOUS | Status: DC

## 2016-12-26 MED ORDER — ASPIRIN 81 MG PO CHEW
81.0000 mg | CHEWABLE_TABLET | Freq: Two times a day (BID) | ORAL | Status: DC
  Administered 2016-12-26 – 2016-12-27 (×2): 81 mg via ORAL
  Filled 2016-12-26 (×2): qty 1

## 2016-12-26 MED ORDER — METHOCARBAMOL 1000 MG/10ML IJ SOLN
500.0000 mg | Freq: Four times a day (QID) | INTRAMUSCULAR | Status: DC | PRN
  Administered 2016-12-26: 500 mg via INTRAVENOUS
  Filled 2016-12-26: qty 550

## 2016-12-26 MED ORDER — BISACODYL 10 MG RE SUPP: 10.0000 mg | Freq: Every day | RECTAL | Status: DC | PRN

## 2016-12-26 MED ORDER — HYDROMORPHONE HCL 1 MG/ML IJ SOLN
0.2500 mg | INTRAMUSCULAR | Status: DC | PRN
  Administered 2016-12-26 (×2): 0.5 mg via INTRAVENOUS

## 2016-12-26 MED ORDER — MENTHOL 3 MG MT LOZG: 1.0000 | LOZENGE | OROMUCOSAL | Status: DC | PRN

## 2016-12-26 MED ORDER — CELECOXIB 200 MG PO CAPS
200.0000 mg | ORAL_CAPSULE | Freq: Two times a day (BID) | ORAL | Status: DC
  Administered 2016-12-26 – 2016-12-27 (×2): 200 mg via ORAL
  Filled 2016-12-26 (×2): qty 1

## 2016-12-26 MED ORDER — CEFAZOLIN SODIUM-DEXTROSE 2-4 GM/100ML-% IV SOLN
INTRAVENOUS | Status: AC
  Filled 2016-12-26: qty 100

## 2016-12-26 MED ORDER — SODIUM CHLORIDE 0.9 % IV SOLN
INTRAVENOUS | Status: DC
  Administered 2016-12-26 (×2): via INTRAVENOUS

## 2016-12-26 MED ORDER — DEXAMETHASONE SODIUM PHOSPHATE 10 MG/ML IJ SOLN
10.0000 mg | Freq: Once | INTRAMUSCULAR | Status: AC
  Administered 2016-12-27: 10 mg via INTRAVENOUS
  Filled 2016-12-26: qty 1

## 2016-12-26 MED ORDER — MIDAZOLAM HCL 5 MG/5ML IJ SOLN
INTRAMUSCULAR | Status: DC | PRN
  Administered 2016-12-26: 2 mg via INTRAVENOUS

## 2016-12-26 MED ORDER — POLYETHYLENE GLYCOL 3350 17 G PO PACK
17.0000 g | PACK | Freq: Two times a day (BID) | ORAL | Status: DC
  Administered 2016-12-27: 17 g via ORAL
  Filled 2016-12-26: qty 1

## 2016-12-26 MED ORDER — CABERGOLINE 0.5 MG PO TABS: 0.2500 mg | ORAL_TABLET | ORAL | Status: DC

## 2016-12-26 MED ORDER — SODIUM CHLORIDE 0.9 % IV SOLN
1000.0000 mg | INTRAVENOUS | Status: AC
  Administered 2016-12-26: 1000 mg via INTRAVENOUS
  Filled 2016-12-26: qty 1100

## 2016-12-26 MED ORDER — SODIUM CHLORIDE 0.9 % IR SOLN
Status: DC | PRN
  Administered 2016-12-26: 1000 mL

## 2016-12-26 MED ORDER — CEFAZOLIN SODIUM-DEXTROSE 2-4 GM/100ML-% IV SOLN
2.0000 g | Freq: Four times a day (QID) | INTRAVENOUS | Status: AC
  Administered 2016-12-26 (×2): 2 g via INTRAVENOUS
  Filled 2016-12-26 (×2): qty 100

## 2016-12-26 MED ORDER — HYDROCODONE-ACETAMINOPHEN 7.5-325 MG PO TABS: 1.0000 | ORAL_TABLET | ORAL | 0 refills | Status: DC | PRN

## 2016-12-26 MED ORDER — AMLODIPINE BESYLATE 5 MG PO TABS
5.0000 mg | ORAL_TABLET | Freq: Every day | ORAL | Status: DC
  Administered 2016-12-26: 5 mg via ORAL
  Filled 2016-12-26: qty 1

## 2016-12-26 MED ORDER — PHENOL 1.4 % MT LIQD: 1.0000 | OROMUCOSAL | Status: DC | PRN

## 2016-12-26 MED ORDER — PROPOFOL 500 MG/50ML IV EMUL
INTRAVENOUS | Status: DC | PRN
Start: 2016-12-26 — End: 2016-12-26
  Administered 2016-12-26: 140 ug/kg/min via INTRAVENOUS

## 2016-12-26 MED ORDER — MIDAZOLAM HCL 2 MG/2ML IJ SOLN
INTRAMUSCULAR | Status: AC
  Filled 2016-12-26: qty 2

## 2016-12-26 MED ORDER — TRANEXAMIC ACID 1000 MG/10ML IV SOLN
1000.0000 mg | Freq: Once | INTRAVENOUS | Status: AC
  Administered 2016-12-26: 1000 mg via INTRAVENOUS
  Filled 2016-12-26: qty 1100

## 2016-12-26 MED ORDER — EPHEDRINE SULFATE-NACL 50-0.9 MG/10ML-% IV SOSY
PREFILLED_SYRINGE | INTRAVENOUS | Status: DC | PRN
  Administered 2016-12-26: 5 mg via INTRAVENOUS

## 2016-12-26 MED ORDER — PROPOFOL 10 MG/ML IV BOLUS
INTRAVENOUS | Status: AC
  Filled 2016-12-26: qty 60

## 2016-12-26 MED ORDER — OMEPRAZOLE 20 MG PO CPDR
20.0000 mg | DELAYED_RELEASE_CAPSULE | Freq: Every day | ORAL | Status: DC
  Administered 2016-12-26: 20 mg via ORAL
  Filled 2016-12-26: qty 1

## 2016-12-26 MED ORDER — DEXAMETHASONE SODIUM PHOSPHATE 10 MG/ML IJ SOLN
10.0000 mg | Freq: Once | INTRAMUSCULAR | Status: AC
  Administered 2016-12-26: 10 mg via INTRAVENOUS

## 2016-12-26 MED ORDER — HYDROCODONE-ACETAMINOPHEN 7.5-325 MG PO TABS
1.0000 | ORAL_TABLET | ORAL | Status: DC
  Administered 2016-12-26 (×3): 2 via ORAL
  Filled 2016-12-26 (×3): qty 2

## 2016-12-26 MED ORDER — DEXAMETHASONE SODIUM PHOSPHATE 10 MG/ML IJ SOLN
INTRAMUSCULAR | Status: AC
  Filled 2016-12-26: qty 1

## 2016-12-26 MED ORDER — ALUM & MAG HYDROXIDE-SIMETH 200-200-20 MG/5ML PO SUSP: 15.0000 mL | ORAL | Status: DC | PRN

## 2016-12-26 MED ORDER — DIPHENHYDRAMINE HCL 25 MG PO CAPS: 25.0000 mg | ORAL_CAPSULE | Freq: Four times a day (QID) | ORAL | Status: DC | PRN

## 2016-12-26 MED ORDER — ONDANSETRON HCL 4 MG/2ML IJ SOLN
INTRAMUSCULAR | Status: AC
  Filled 2016-12-26: qty 2

## 2016-12-26 MED ORDER — CEFAZOLIN SODIUM-DEXTROSE 2-4 GM/100ML-% IV SOLN
2.0000 g | INTRAVENOUS | Status: AC
Start: 2016-12-26 — End: 2016-12-26
  Administered 2016-12-26: 2 g via INTRAVENOUS

## 2016-12-26 MED ORDER — FERROUS SULFATE 325 (65 FE) MG PO TABS
325.0000 mg | ORAL_TABLET | Freq: Three times a day (TID) | ORAL | Status: DC
  Administered 2016-12-27: 325 mg via ORAL
  Filled 2016-12-26: qty 1

## 2016-12-26 MED ORDER — DOCUSATE SODIUM 100 MG PO CAPS
100.0000 mg | ORAL_CAPSULE | Freq: Two times a day (BID) | ORAL | Status: DC
  Administered 2016-12-26 – 2016-12-27 (×2): 100 mg via ORAL
  Filled 2016-12-26 (×2): qty 1

## 2016-12-26 MED ORDER — ATORVASTATIN CALCIUM 20 MG PO TABS
20.0000 mg | ORAL_TABLET | Freq: Every evening | ORAL | Status: DC
  Administered 2016-12-26: 20 mg via ORAL
  Filled 2016-12-26: qty 1

## 2016-12-26 MED ORDER — LIDOCAINE 2% (20 MG/ML) 5 ML SYRINGE
INTRAMUSCULAR | Status: AC
  Filled 2016-12-26: qty 5

## 2016-12-26 SURGICAL SUPPLY — 34 items
BAG ZIPLOCK 12X15 (MISCELLANEOUS) ×2 IMPLANT
BLADE SAG 18X100X1.27 (BLADE) ×2 IMPLANT
CAPT HIP TOTAL 2 ×2 IMPLANT
CLOTH BEACON ORANGE TIMEOUT ST (SAFETY) ×2 IMPLANT
COVER PERINEAL POST (MISCELLANEOUS) ×2 IMPLANT
COVER SURGICAL LIGHT HANDLE (MISCELLANEOUS) ×2 IMPLANT
DERMABOND ADVANCED (GAUZE/BANDAGES/DRESSINGS) ×1
DERMABOND ADVANCED .7 DNX12 (GAUZE/BANDAGES/DRESSINGS) ×1 IMPLANT
DRAPE STERI IOBAN 125X83 (DRAPES) ×2 IMPLANT
DRAPE U-SHAPE 47X51 STRL (DRAPES) ×4 IMPLANT
DRESSING AQUACEL AG SP 3.5X10 (GAUZE/BANDAGES/DRESSINGS) ×1 IMPLANT
DRSG AQUACEL AG SP 3.5X10 (GAUZE/BANDAGES/DRESSINGS) ×2
DURAPREP 26ML APPLICATOR (WOUND CARE) ×2 IMPLANT
ELECT REM PT RETURN 15FT ADLT (MISCELLANEOUS) ×2 IMPLANT
GLOVE BIOGEL PI IND STRL 7.5 (GLOVE) ×6 IMPLANT
GLOVE BIOGEL PI IND STRL 8.5 (GLOVE) ×1 IMPLANT
GLOVE BIOGEL PI INDICATOR 7.5 (GLOVE) ×6
GLOVE BIOGEL PI INDICATOR 8.5 (GLOVE) ×1
GLOVE ECLIPSE 8.0 STRL XLNG CF (GLOVE) ×4 IMPLANT
GLOVE ORTHO TXT STRL SZ7.5 (GLOVE) ×2 IMPLANT
GLOVE SURG SS PI 7.5 STRL IVOR (GLOVE) ×2 IMPLANT
GOWN SPEC L3 XXLG W/TWL (GOWN DISPOSABLE) ×2 IMPLANT
GOWN STRL REUS W/TWL LRG LVL3 (GOWN DISPOSABLE) ×2 IMPLANT
GOWN STRL REUS W/TWL XL LVL3 (GOWN DISPOSABLE) ×4 IMPLANT
HOLDER FOLEY CATH W/STRAP (MISCELLANEOUS) ×2 IMPLANT
PACK ANTERIOR HIP CUSTOM (KITS) ×2 IMPLANT
SUT MNCRL AB 4-0 PS2 18 (SUTURE) ×2 IMPLANT
SUT STRATAFIX 0 PDS 27 VIOLET (SUTURE) ×2
SUT VIC AB 1 CT1 36 (SUTURE) ×6 IMPLANT
SUT VIC AB 2-0 CT1 27 (SUTURE) ×2
SUT VIC AB 2-0 CT1 TAPERPNT 27 (SUTURE) ×2 IMPLANT
SUTURE STRATFX 0 PDS 27 VIOLET (SUTURE) ×1 IMPLANT
TRAY FOLEY W/METER SILVER 16FR (SET/KITS/TRAYS/PACK) ×2 IMPLANT
YANKAUER SUCT BULB TIP 10FT TU (MISCELLANEOUS) ×2 IMPLANT

## 2016-12-26 NOTE — Op Note (Signed)
NAME:  Robert Carroll NO.: [id number]      MEDICAL RECORD NO.: [id number]      FACILITY:  [facility]      PHYSICIAN:  Paralee Cancel D  DATE OF BIRTH:  16-Apr-1956     DATE OF PROCEDURE:  12/26/2016                                 OPERATIVE REPORT         PREOPERATIVE DIAGNOSIS: Right  hip osteoarthritis.      POSTOPERATIVE DIAGNOSIS:  Right hip osteoarthritis.      PROCEDURE:  Right total hip replacement through an anterior approach   utilizing DePuy THR system, component size 82mm pinnacle cup, a size 36+4 neutral   Altrex liner, a size 7 Hi Tri Lock stem with a 36+1.5 delta ceramic   ball.      SURGEON:  Pietro Cassis. Alvan Dame, M.D.      ASSISTANT:  Danae Orleans, PA-C     ANESTHESIA:  Spinal.      SPECIMENS:  None.      COMPLICATIONS:  None.      BLOOD LOSS:  350 cc     DRAINS:  None.      INDICATION OF THE PROCEDURE:  Robert Carroll is a 61 y.o. male who had   presented to office for evaluation of right hip pain.  Radiographs revealed   progressive degenerative changes with bone-on-bone   articulation to the  hip joint.  The patient had painful limited range of   motion significantly affecting their overall quality of life.  The patient was failing to    respond to conservative measures, and at this point was ready   to proceed with more definitive measures.  The patient has noted progressive   degenerative changes in his hip, progressive problems and dysfunction   with regarding the hip prior to surgery.  Consent was obtained for   benefit of pain relief.  Specific risk of infection, DVT, component   failure, dislocation, need for revision surgery, as well discussion of   the anterior versus posterior approach were reviewed.  Consent was   obtained for benefit of anterior pain relief through an anterior   approach.      PROCEDURE IN DETAIL:  The patient was brought to operative theater.   Once adequate anesthesia, preoperative  antibiotics, 2 gm of Ancef, 1 gm of Tranexamic Acid, and 10 mg of Decadron administered.   The patient was positioned supine on the OSI [place] table.  Once adequate   padding of boney process was carried out, we had predraped out the hip, and  used fluoroscopy to confirm orientation of the pelvis and position.      The right hip was then prepped and draped from proximal iliac crest to   mid thigh with shower curtain technique.      Time-out was performed identifying the patient, planned procedure, and   extremity.     An incision was then made 2 cm distal and lateral to the   anterior superior iliac spine extending over the orientation of the   tensor fascia lata muscle and sharp dissection was carried down to the   fascia of the muscle and protractor placed in the soft tissues.      The fascia was then  incised.  The muscle belly was identified and swept   laterally and retractor placed along the superior neck.  Following   cauterization of the circumflex vessels and removing some pericapsular   fat, a second cobra retractor was placed on the inferior neck.  A third   retractor was placed on the anterior acetabulum after elevating the   anterior rectus.  A L-capsulotomy was along the line of the   superior neck to the trochanteric fossa, then extended proximally and   distally.  Tag sutures were placed and the retractors were then placed   intracapsular.  We then identified the trochanteric fossa and   orientation of my neck cut, confirmed this radiographically   and then made a neck osteotomy with the femur on traction.  The femoral   head was removed without difficulty or complication.  Traction was let   off and retractors were placed posterior and anterior around the   acetabulum.      The labrum and foveal tissue were debrided.  I began reaming with a 86mm   reamer and reamed up to 59mm reamer with good bony bed preparation and a 25mm   cup was chosen.  The final 39mm Pinnacle  cup was then impacted under fluoroscopy  to confirm the depth of penetration and orientation with respect to   abduction.  A screw was placed followed by the hole eliminator.  The final   36+4 neutral Altrex liner was impacted with good visualized rim fit.  The cup was positioned anatomically within the acetabular portion of the pelvis.      At this point, the femur was rolled at 80 degrees.  Further capsule was   released off the inferior aspect of the femoral neck.  I then   released the superior capsule proximally.  The hook was placed laterally   along the femur and elevated manually and held in position with the bed   hook.  The leg was then extended and adducted with the leg rolled to 100   degrees of external rotation.  Once the proximal femur was fully   exposed, I used a box osteotome to set orientation.  I then began   broaching with the starting chili pepper broach and passed this by hand and then broached up to 7.  With the 7 broach in place I chose a high offset neck and did several trial reductions.  The offset was appropriate, leg lengths   appeared to be equal best matched with the +1.5 head ball confirmed radiographically.   Given these findings, I went ahead and dislocated the hip, repositioned all   retractors and positioned the right hip in the extended and abducted position.  The final 7 Hi Tri Lock stem was   chosen and it was impacted down to the level of neck cut.  Based on this   and the trial reduction, a 36+1.5 delta ceramic ball was chosen and   impacted onto a clean and dry trunnion, and the hip was reduced.  The   hip had been irrigated throughout the case again at this point.  I did   reapproximate the superior capsular leaflet to the anterior leaflet   using #1 Vicryl.  The fascia of the   tensor fascia lata muscle was then reapproximated using #1 Vicryl and #0 Stratafix sutures.  The   remaining wound was closed with 2-0 Vicryl and running 4-0 Monocryl.   The  hip was cleaned, dried, and dressed  sterilely using Dermabond and   Aquacel dressing.  He was then brought   to recovery room in stable condition tolerating the procedure well.    Danae Orleans, PA-C was present for the entirety of the case involved from   preoperative positioning, perioperative retractor management, general   facilitation of the case, as well as primary wound closure as assistant.            Pietro Cassis Alvan Dame, M.D.        12/26/2016 8:38 AM

## 2016-12-26 NOTE — Anesthesia Procedure Notes (Signed)
Spinal  Patient location during procedure: OR Start time: 12/26/2016 7:12 AM End time: 12/26/2016 7:17 AM Staffing Resident/CRNA: Danley Danker L Performed: resident/CRNA  Preanesthetic Checklist Completed: patient identified, site marked, surgical consent, pre-op evaluation, timeout performed, IV checked, risks and benefits discussed and monitors and equipment checked Spinal Block Patient position: sitting Prep: Betadine Patient monitoring: heart rate, continuous pulse ox and blood pressure Approach: midline Location: L3-4 Injection technique: single-shot Needle Needle type: Sprotte  Needle gauge: 24 G Needle length: 9 cm Additional Notes Kit expiration date 05/24/2019 and lot #[id number] Clear CSF free flowing, negative heme, negative paresthesia Tolerated well and returned to supine position

## 2016-12-26 NOTE — Interval H&P Note (Signed)
History and Physical Interval Note:  12/26/2016 6:55 AM  Robert Carroll  has presented today for surgery, with the diagnosis of Right hip osteoarthritis  The various methods of treatment have been discussed with the patient and family. After consideration of risks, benefits and other options for treatment, the patient has consented to  Procedure(s): RIGHT TOTAL HIP ARTHROPLASTY ANTERIOR APPROACH (Right) as a surgical intervention .  The patient's history has been reviewed, patient examined, no change in status, stable for surgery.  I have reviewed the patient's chart and labs.  Questions were answered to the patient's satisfaction.     Mauri Pole

## 2016-12-26 NOTE — Evaluation (Signed)
Physical Therapy Evaluation Patient Details Name: Robert Carroll MRN: [id number] DOB: 1956-03-16 Today's Date: 12/26/2016   History of Present Illness  61 yo male s/p R THA-direct anterior 12/26/16  Clinical Impression  On eval POD 0, pt required Min assist for mobility. He walked ~115 feet with a RW. Pain rated 3/10. Will follow and progress activity as tolerated.     Follow Up Recommendations No PT follow up    Equipment Recommendations  None recommended by PT    Recommendations for Other Services       Precautions / Restrictions Precautions Precautions: Fall Restrictions Weight Bearing Restrictions: No RLE Weight Bearing: Weight bearing as tolerated      Mobility  Bed Mobility Overal bed mobility: Needs Assistance Bed Mobility: Supine to Sit     Supine to sit: HOB elevated;Min assist     General bed mobility comments: small amount of assist for R LE  Transfers Overall transfer level: Needs assistance Equipment used: Rolling walker (2 wheeled) Transfers: Sit to/from Stand Sit to Stand: Min guard;From elevated surface         General transfer comment: close guard for safety. VCs safety, hand/LE placement  Ambulation/Gait Ambulation/Gait assistance: Min guard Ambulation Distance (Feet): 115 Feet Assistive device: Rolling walker (2 wheeled) Gait Pattern/deviations: Step-to pattern;Step-through pattern;Decreased stride length     General Gait Details: close guard for safety. VCs safety, sequence.   Stairs            Wheelchair Mobility    Modified Rankin (Stroke Patients Only)       Balance                                             Pertinent Vitals/Pain Pain Assessment: 0-10 Pain Score: 3  Pain Location: R hip/thigh Pain Descriptors / Indicators: Sore Pain Intervention(s): Monitored during session;Repositioned;Ice applied    Home Living Family/patient expects to be discharged to:: Private residence Living Arrangements:  Spouse/significant other Available Help at Discharge: Family Type of Home: House Home Access: Stairs to enter Entrance Stairs-Rails: Right Entrance Stairs-Number of Steps: 3 Home Layout: Two level;Bed/bath upstairs;Able to live on main level with bedroom/bathroom Home Equipment: Gilford Rile - 2 wheels;Bedside commode      Prior Function Level of Independence: Independent               Hand Dominance        Extremity/Trunk Assessment   Upper Extremity Assessment Upper Extremity Assessment: Overall WFL for tasks assessed    Lower Extremity Assessment Lower Extremity Assessment: Generalized weakness (s/p R THA)    Cervical / Trunk Assessment Cervical / Trunk Assessment: Normal  Communication   Communication: No difficulties  Cognition Arousal/Alertness: Awake/alert Behavior During Therapy: WFL for tasks assessed/performed Overall Cognitive Status: Within Functional Limits for tasks assessed                                        General Comments      Exercises     Assessment/Plan    PT Assessment Patient needs continued PT services  PT Problem List Decreased strength;Decreased mobility;Decreased range of motion;Decreased activity tolerance;Decreased balance;Decreased knowledge of use of DME;Pain       PT Treatment Interventions Gait training;Therapeutic activities;Therapeutic exercise;Patient/family education;Balance training;Functional mobility training;DME instruction;Stair training  PT Goals (Current goals can be found in the Care Plan section)  Acute Rehab PT Goals Patient Stated Goal: regain independence. get on a flight in ~1 month PT Goal Formulation: With patient/family Time For Goal Achievement: 01/09/17 Potential to Achieve Goals: Good    Frequency 7X/week   Barriers to discharge        Co-evaluation               AM-PAC PT "6 Clicks" Daily Activity  Outcome Measure Difficulty turning over in bed (including adjusting  bedclothes, sheets and blankets)?: A Little Difficulty moving from lying on back to sitting on the side of the bed? : A Little Difficulty sitting down on and standing up from a chair with arms (e.g., wheelchair, bedside commode, etc,.)?: A Little Help needed moving to and from a bed to chair (including a wheelchair)?: A Little Help needed walking in hospital room?: A Little Help needed climbing 3-5 steps with a railing? : A Little 6 Click Score: 18    End of Session Equipment Utilized During Treatment: Gait belt Activity Tolerance: Patient tolerated treatment well Patient left: in chair;with family/visitor present;with call bell/phone within reach   PT Visit Diagnosis: Muscle weakness (generalized) (M62.81);Difficulty in walking, not elsewhere classified (R26.2)    Time: 0677-0340 PT Time Calculation (min) (ACUTE ONLY): 26 min   Charges:   PT Evaluation $PT Eval Low Complexity: 1 Procedure PT Treatments $Gait Training: 8-22 mins   PT G Codes:          Weston Anna, MPT Pager: [phone]

## 2016-12-26 NOTE — Anesthesia Postprocedure Evaluation (Signed)
Anesthesia Post Note  Patient: Robert Carroll  Procedure(s) Performed: Procedure(s) (LRB): RIGHT TOTAL HIP ARTHROPLASTY ANTERIOR APPROACH (Right)     Patient location during evaluation: PACU Anesthesia Type: Spinal Level of consciousness: oriented and awake and alert Pain management: pain level controlled Vital Signs Assessment: post-procedure vital signs reviewed and stable Respiratory status: spontaneous breathing, respiratory function stable and patient connected to nasal cannula oxygen Cardiovascular status: blood pressure returned to baseline and stable Postop Assessment: no headache and no backache Anesthetic complications: no    Last Vitals:  Vitals:   12/26/16 1030 12/26/16 1045  BP: 124/78 117/81  Pulse: (!) 57 (!) 53  Resp: (!) 7 10  Temp:      Last Pain:  Vitals:   12/26/16 1030  TempSrc:   PainSc: 5                  Shahram Alexopoulos S

## 2016-12-26 NOTE — Discharge Instructions (Signed)

## 2016-12-26 NOTE — Transfer of Care (Signed)
Immediate Anesthesia Transfer of Care Note  Patient: Robert Carroll  Procedure(s) Performed: Procedure(s): RIGHT TOTAL HIP ARTHROPLASTY ANTERIOR APPROACH (Right)  Patient Location: PACU  Anesthesia Type:Spinal  Level of Consciousness: awake and alert   Airway & Oxygen Therapy: Patient Spontanous Breathing and Patient connected to face mask oxygen  Post-op Assessment: Report given to RN and Post -op Vital signs reviewed and stable  Post vital signs: Reviewed and stable  Last Vitals:  Vitals:   12/26/16 0512 12/26/16 0550  BP: (!) 145/101 (!) 140/97  Pulse:    Resp:    Temp:      Last Pain:  Vitals:   12/26/16 0511  TempSrc: Oral      Patients Stated Pain Goal: 4 (59/74/71 8550)  Complications: No apparent anesthesia complications

## 2016-12-27 ENCOUNTER — Encounter (HOSPITAL_COMMUNITY): Payer: Self-pay | Admitting: Orthopedic Surgery

## 2016-12-27 DIAGNOSIS — E663 Overweight: Secondary | ICD-10-CM | POA: Diagnosis present

## 2016-12-27 LAB — BASIC METABOLIC PANEL
Anion gap: 5 (ref 5–15)
BUN: 16 mg/dL (ref 6–20)
CHLORIDE: 110 mmol/L (ref 101–111)
CO2: 24 mmol/L (ref 22–32)
CREATININE: 0.96 mg/dL (ref 0.61–1.24)
Calcium: 8.2 mg/dL — ABNORMAL LOW (ref 8.9–10.3)
GFR calc Af Amer: >60 mL/min (ref >60)
GFR calc non Af Amer: >60 mL/min (ref >60)
Glucose, Bld: 146 mg/dL — ABNORMAL HIGH (ref 65–99)
POTASSIUM: 4 mmol/L (ref 3.5–5.1)
Sodium: 139 mmol/L (ref 135–145)

## 2016-12-27 LAB — CBC
HCT: 34.5 % — ABNORMAL LOW (ref 39.0–52.0)
HEMOGLOBIN: 11.8 g/dL — AB (ref 13.0–17.0)
MCH: 31.1 pg (ref 26.0–34.0)
MCHC: 34.2 g/dL (ref 30.0–36.0)
MCV: 90.8 fL (ref 78.0–100.0)
Platelets: 126 10*3/uL — ABNORMAL LOW (ref 150–400)
RBC: 3.8 MIL/uL — AB (ref 4.22–5.81)
RDW: 13 % (ref 11.5–15.5)
WBC: 9.5 10*3/uL (ref 4.0–10.5)

## 2016-12-27 MED ORDER — OXYCODONE HCL 5 MG PO TABS: 5.0000 mg | ORAL_TABLET | ORAL | 0 refills | Status: DC | PRN

## 2016-12-27 MED ORDER — ACETAMINOPHEN 325 MG PO TABS: 650.0000 mg | ORAL_TABLET | ORAL | Status: DC | PRN

## 2016-12-27 NOTE — Progress Notes (Signed)
Occupational Therapy Evaluation Patient Details Name: Robert Carroll MRN: [id number] DOB: 05-07-1956 Today's Date: 12/27/2016    History of Present Illness 61 yo male s/p R THA-direct anterior 12/26/16   Clinical Impression   Completed all education regarding compensatory technqieus for ADL, use of AE as needed and functional mobility with DME. Educated on set up of home to [place] and reduce risk of falls. Pt verbalized understanding. Pt safe to DC home when medically stable.     Follow Up Recommendations  No OT follow up;Supervision - Intermittent    Equipment Recommendations  Other (comment) (RW)    Recommendations for Other Services       Precautions / Restrictions Precautions Precautions: Fall Restrictions Weight Bearing Restrictions: No RLE Weight Bearing: Weight bearing as tolerated      Mobility Bed Mobility Overal bed mobility: Modified Independent             General bed mobility comments: Educated pt on using LLE to help mobilize RLE in/out of bed  Transfers Overall transfer level: Modified independent Equipment used: Rolling walker (2 wheeled)                  Balance Overall balance assessment: No apparent balance deficits (not formally assessed)                                         ADL either performed or assessed with clinical judgement   ADL Overall ADL's : Needs assistance/impaired                                     Functional mobility during ADLs: Modified independent;Rolling walker General ADL Comments: Education completed regarding compensatory technqieus and use of AE for LB ADL if needed. Pt educated on shower transfer and use of 3 in1 to increase toilet height and to use as shower chair. Pt able to return demonstrate shower transfer. Educated on home set up to maximize independence and reduce risk of falls.      Vision Baseline Vision/History: Wears glasses Wears Glasses: At all  times       Perception     Praxis      Pertinent Vitals/Pain Pain Assessment: 0-10 Pain Score: 3  Pain Location: R hip/thigh Pain Descriptors / Indicators: Sore Pain Intervention(s): Limited activity within patient's tolerance     Hand Dominance Right   Extremity/Trunk Assessment Upper Extremity Assessment Upper Extremity Assessment: Overall WFL for tasks assessed   Lower Extremity Assessment Lower Extremity Assessment: Defer to PT evaluation   Cervical / Trunk Assessment Cervical / Trunk Assessment: Normal   Communication Communication Communication: No difficulties   Cognition Arousal/Alertness: Awake/alert Behavior During Therapy: WFL for tasks assessed/performed Overall Cognitive Status: Within Functional Limits for tasks assessed                                     General Comments       Exercises     Shoulder Instructions      Home Living Family/patient expects to be discharged to:: Private residence Living Arrangements: Spouse/significant other Available Help at Discharge: Family Type of Home: House Home Access: Stairs to enter Technical brewer of Steps: 3 Entrance Stairs-Rails: Right Home Layout: Two level;Bed/bath upstairs;Able to  live on main level with bedroom/bathroom     Bathroom Shower/Tub: Occupational psychologist: Standard Bathroom Accessibility: Yes How Accessible: Accessible via walker Home Equipment: Bedside commode          Prior Functioning/Environment Level of Independence: Independent                 OT Problem List: Decreased strength;Decreased range of motion;Decreased knowledge of use of DME or AE;Pain      OT Treatment/Interventions:      OT Goals(Current goals can be found in the care plan section) Acute Rehab OT Goals Patient Stated Goal: regain independence. get on a flight in ~1 month OT Goal Formulation: All assessment and education complete, DC therapy  OT Frequency:      Barriers to D/C:            Co-evaluation              AM-PAC PT "6 Clicks" Daily Activity     Outcome Measure Help from another person eating meals?: None Help from another person taking care of personal grooming?: None Help from another person toileting, which includes using toliet, bedpan, or urinal?: A Little Help from another person bathing (including washing, rinsing, drying)?: A Little Help from another person to put on and taking off regular upper body clothing?: None Help from another person to put on and taking off regular lower body clothing?: A Little 6 Click Score: 21   End of Session Equipment Utilized During Treatment: Rolling walker Nurse Communication: Mobility status  Activity Tolerance: Patient tolerated treatment well Patient left: in bed;with call bell/phone within reach;with family/visitor present  OT Visit Diagnosis: Other abnormalities of gait and mobility (R26.89);Pain;Muscle weakness (generalized) (M62.81) Pain - Right/Left: Right Pain - part of body: Hip                Time: 7371-0626 OT Time Calculation (min): 25 min Charges:  OT General Charges $OT Visit: 1 Procedure OT Evaluation $OT Eval Low Complexity: 1 Procedure OT Treatments $Self Care/Home Management : 8-22 mins G-Codes:     [facility], OT/L  [phone] 12/27/2016  Gorden Stthomas,HILLARY 12/27/2016, 10:08 AM

## 2016-12-27 NOTE — Progress Notes (Signed)
Physical Therapy Treatment Patient Details Name: Robert Carroll MRN: [id number] DOB: March 09, 1956 Today's Date: 12/27/2016    History of Present Illness 61 yo male s/p R THA-direct anterior 12/26/16    PT Comments    Progressing well with mobility. Reviewed/practiced exercises, gait training, and stair training. Okay to d/c from PT standpoint-made RN aware.    Follow Up Recommendations  No PT follow up     Equipment Recommendations  None recommended by PT    Recommendations for Other Services       Precautions / Restrictions Precautions Precautions: Fall Restrictions Weight Bearing Restrictions: No RLE Weight Bearing: Weight bearing as tolerated    Mobility  Bed Mobility Overal bed mobility: Needs Assistance Bed Mobility: Supine to Sit;Sit to Supine     Supine to sit: Supervision Sit to supine: Supervision   General bed mobility comments: Pt hooked R leg with L foot to get LE onto bed.   Transfers Overall transfer level: Needs assistance Equipment used: Rolling walker (2 wheeled) Transfers: Sit to/from Stand Sit to Stand: Supervision         General transfer comment: close guard for safety. VCs safety, hand/LE placement  Ambulation/Gait Ambulation/Gait assistance: Supervision Ambulation Distance (Feet): 125 Feet Assistive device: Rolling walker (2 wheeled) Gait Pattern/deviations: Step-to pattern;Step-through pattern;Decreased stride length;Decreased step length - right     General Gait Details: close guard for safety.   Stairs Stairs: Yes   Stair Management: Alternating pattern;Step to pattern;Forwards;Two rails Number of Stairs: 2 General stair comments: VCs safety, technique, sequence.   Wheelchair Mobility    Modified Rankin (Stroke Patients Only)       Balance Overall balance assessment: No apparent balance deficits (not formally assessed)                                          Cognition Arousal/Alertness:  Awake/alert Behavior During Therapy: WFL for tasks assessed/performed Overall Cognitive Status: Within Functional Limits for tasks assessed                                        Exercises Total Joint Exercises Ankle Circles/Pumps: AROM;Both;10 reps;Supine Quad Sets: AROM;Both;10 reps;Supine Heel Slides: AROM;Right;10 reps;Supine Hip ABduction/ADduction: AROM;Right;10 reps;Supine;Standing Long Arc Quad: AROM;Right;10 reps;Seated Knee Flexion: AROM;Right;10 reps;Standing Marching in Standing: AROM;Both;10 reps;Standing General Exercises - Lower Extremity Heel Raises: AROM;Both;10 reps;Standing    General Comments        Pertinent Vitals/Pain Pain Assessment: 0-10 Pain Score: 5  Pain Location: R hip/thigh with activity Pain Descriptors / Indicators: Sore Pain Intervention(s): Monitored during session;Repositioned;Ice applied    Home Living Family/patient expects to be discharged to:: Private residence Living Arrangements: Spouse/significant other Available Help at Discharge: Family Type of Home: House Home Access: Stairs to enter Entrance Stairs-Rails: Right Home Layout: Two level;Bed/bath upstairs;Able to live on main level with bedroom/bathroom Home Equipment: Bedside commode      Prior Function Level of Independence: Independent          PT Goals (current goals can now be found in the care plan section) Acute Rehab PT Goals Patient Stated Goal: regain independence. get on a flight in ~1 month Progress towards PT goals: Progressing toward goals    Frequency    7X/week      PT Plan Current plan remains appropriate  Co-evaluation              AM-PAC PT "6 Clicks" Daily Activity  Outcome Measure  Difficulty turning over in bed (including adjusting bedclothes, sheets and blankets)?: None Difficulty moving from lying on back to sitting on the side of the bed? : None Difficulty sitting down on and standing up from a chair with arms  (e.g., wheelchair, bedside commode, etc,.)?: A Little Help needed moving to and from a bed to chair (including a wheelchair)?: A Little Help needed walking in hospital room?: A Little Help needed climbing 3-5 steps with a railing? : A Little 6 Click Score: 20    End of Session Equipment Utilized During Treatment: Gait belt Activity Tolerance: Patient tolerated treatment well Patient left: in bed;with call bell/phone within reach   PT Visit Diagnosis: Muscle weakness (generalized) (M62.81);Difficulty in walking, not elsewhere classified (R26.2)     Time: 1610-9604 PT Time Calculation (min) (ACUTE ONLY): 20 min  Charges:  $Gait Training: 8-22 mins                    G Codes:          Weston Anna, MPT Pager: [phone]

## 2016-12-27 NOTE — Progress Notes (Signed)
Spoke with patient and spouse at bedside. Patient had DME delivered prior to surgery. He plans to go home with exercise instruction. No PT. No further d/c needs identified. [phone]

## 2016-12-27 NOTE — Progress Notes (Signed)
     Subjective: 1 Day Post-Op Procedure(s) (LRB): RIGHT TOTAL HIP ARTHROPLASTY ANTERIOR APPROACH (Right)   Patient reports pain as mild, pain controlled. He states that he can definitely know we did something to him, but the pain is different than it was prior to surgery.  No events throughout the night.  Ready to be discharged home.   Objective:   VITALS:   Vitals:   12/27/16 0150 12/27/16 0551  BP: 117/75 126/75  Pulse: 78 76  Resp: 18 18  Temp: 98.6 F (37 C) 98.1 F (36.7 C)    Dorsiflexion/Plantar flexion intact Incision: dressing C/D/I No cellulitis present Compartment soft  LABS  Recent Labs  12/27/16 0435  HGB 11.8*  HCT 34.5*  WBC 9.5  PLT 126*     Recent Labs  12/27/16 0435  NA 139  K 4.0  BUN 16  CREATININE 0.96  GLUCOSE 146*     Assessment/Plan: 1 Day Post-Op Procedure(s) (LRB): RIGHT TOTAL HIP ARTHROPLASTY ANTERIOR APPROACH (Right) Foley cath d/c'ed Advance diet Up with therapy D/C IV fluids Discharge home Follow up in 2 weeks at [facility]. Follow up with OLIN,Davonn Flanery D in 2 weeks.  Contact information:  [facility] [address], [place] [phone]    Overweight (BMI 25-29.9) Estimated body mass index is 29.84 kg/m as calculated from the following:   Height as of this encounter: 6' (1.829 m).   Weight as of this encounter: 99.8 kg (220 lb). Patient also counseled that weight may inhibit the healing process Patient counseled that losing weight will help with future health issues        West Pugh. Tremaine Earwood   PAC  12/27/2016, 8:44 AM

## 2016-12-28 NOTE — Discharge Summary (Signed)
Physician Discharge Summary  Patient ID: Jayveon Convey MRN: [id number] DOB/AGE: 09-14-1955 61 y.o.  Admit date: 12/26/2016 Discharge date: 12/27/2016   Procedures:  Procedure(s) (LRB): RIGHT TOTAL HIP ARTHROPLASTY ANTERIOR APPROACH (Right)  Attending Physician:  Dr. Paralee Cancel   Admission Diagnoses:   Right hip primary OA / pain  Discharge Diagnoses:  Principal Problem:   S/P right THA, AA Active Problems:   Overweight (BMI 25.0-29.9)  Past Medical History:  Diagnosis Date  . Colon polyp    adenomatous, 2008  . Genital herpes   . Hyperlipidemia   . Hypertension     HPI:    Nechemia Chiappetta, 61 y.o. male, has a history of pain and functional disability in the right hip(s) due to arthritis and patient has failed non-surgical conservative treatments for greater than 12 weeks to include NSAID's and/or analgesics, corticosteriod injections and activity modification.  Onset of symptoms was gradual starting ~10 years ago with gradually worsening course since that time.The patient noted no past surgery on the right hip(s).  Patient currently rates pain in the right hip at 8 out of 10 with activity. Patient has night pain, worsening of pain with activity and weight bearing, trendelenberg gait, pain that interfers with activities of daily living and pain with passive range of motion. Patient has evidence of periarticular osteophytes and joint space narrowing by imaging studies. This condition presents safety issues increasing the risk of falls.  There is no current active infection.   Risks, benefits and expectations were discussed with the patient.  Risks including but not limited to the risk of anesthesia, blood clots, nerve damage, blood vessel damage, failure of the prosthesis, infection and up to and including death.  Patient understand the risks, benefits and expectations and wishes to proceed with surgery.   PCP: Antony Contras, MD   Discharged Condition: good  Hospital Course:  Patient  underwent the above stated procedure on 12/26/2016. Patient tolerated the procedure well and brought to the recovery room in good condition and subsequently to the floor.  POD #1 BP: 126/75 ; Pulse: 76 ; Temp: 98.1 F (36.7 C) ; Resp: 18 Patient reports pain as mild, pain controlled. He states that he can definitely know we did something to him, but the pain is different than it was prior to surgery.  No events throughout the night.  Ready to be discharged home.  Dorsiflexion/plantar flexion intact, incision: dressing C/D/I, no cellulitis present and compartment soft.   LABS  Basename    HGB     11.8  HCT     34.5    Discharge Exam: General appearance: alert, cooperative and no distress Extremities: Homans sign is negative, no sign of DVT, no edema, redness or tenderness in the calves or thighs and no ulcers, gangrene or trophic changes  Disposition: Home with follow up in 2 weeks   Follow-up Information    Paralee Cancel, MD. Schedule an appointment as soon as possible for a visit in 2 week(s).   Specialty:  Orthopedic Surgery Contact information: [address] [place] [address] [phone]           Discharge Instructions    Call MD / Call 911    Complete by:  As directed    If you experience chest pain or shortness of breath, CALL 911 and be transported to the hospital emergency room.  If you develope a fever above 101 F, pus (white drainage) or increased drainage or redness at the wound, or  calf pain, call your surgeon's office.   Change dressing    Complete by:  As directed    Maintain surgical dressing until follow up in the clinic. If the edges start to pull up, may reinforce with tape. If the dressing is no longer working, may remove and cover with gauze and tape, but must keep the area dry and clean.  Call with any questions or concerns.   Constipation Prevention    Complete by:  As directed    Drink plenty of fluids.  Prune juice may be helpful.   You may use a stool softener, such as Colace (over the counter) 100 mg twice a day.  Use MiraLax (over the counter) for constipation as needed.   Diet - low sodium heart healthy    Complete by:  As directed    Discharge instructions    Complete by:  As directed    Maintain surgical dressing until follow up in the clinic. If the edges start to pull up, may reinforce with tape. If the dressing is no longer working, may remove and cover with gauze and tape, but must keep the area dry and clean.  Follow up in 2 weeks at [facility]. Call with any questions or concerns.   Increase activity slowly as tolerated    Complete by:  As directed    Weight bearing as tolerated with assist device (walker, cane, etc) as directed, use it as long as suggested by your surgeon or therapist, typically at least 4-6 weeks.   TED hose    Complete by:  As directed    Use stockings (TED hose) for 2 weeks on both leg(s).  You may remove them at night for sleeping.      Allergies as of 12/27/2016   No Known Allergies     Medication List    STOP taking these medications   aspirin EC 81 MG tablet Replaced by:  aspirin 81 MG chewable tablet   ibuprofen 200 MG tablet Commonly known as:  ADVIL,MOTRIN   meloxicam 15 MG tablet Commonly known as:  MOBIC     TAKE these medications   acetaminophen 325 MG tablet Commonly known as:  TYLENOL Take 2 tablets (650 mg total) by mouth every 4 (four) hours as needed for mild pain, fever or headache.   amLODipine-benazepril 5-20 MG capsule Commonly known as:  LOTREL Take 1 capsule by mouth daily.   aspirin 81 MG chewable tablet Chew 1 tablet (81 mg total) by mouth 2 (two) times daily. Take for 4 weeks, then resume regular dose. Replaces:  aspirin EC 81 MG tablet   atorvastatin 20 MG tablet Commonly known as:  LIPITOR Take 20 mg by mouth every evening.   cabergoline 0.5 MG tablet Commonly known as:  DOSTINEX Take 0.25 mg by mouth 2 (two) times a week.    docusate sodium 100 MG capsule Commonly known as:  COLACE Take 1 capsule (100 mg total) by mouth 2 (two) times daily.   doxazosin 4 MG tablet Commonly known as:  CARDURA Take 4 mg by mouth at bedtime.   ferrous sulfate 325 (65 FE) MG tablet Commonly known as:  FERROUSUL Take 1 tablet (325 mg total) by mouth 3 (three) times daily with meals.   Fish Oil 1000 MG Caps Take 1,000 mg by mouth daily at 3 pm.   fluticasone 50 MCG/ACT nasal spray Commonly known as:  FLONASE Place 1 spray into both nostrils daily as needed for allergies or rhinitis.  gabapentin 300 MG capsule Commonly known as:  NEURONTIN Take 3 capsules (900 mg total) by mouth 3 (three) times daily.   Ginkgo Biloba 120 MG Caps Take 120 mg by mouth daily.   Glucosamine 500 MG Caps Take 500 mg by mouth daily.   loratadine 10 MG tablet Commonly known as:  CLARITIN Take 10 mg by mouth daily.   methocarbamol 500 MG tablet Commonly known as:  ROBAXIN Take 1 tablet (500 mg total) by mouth every 6 (six) hours as needed for muscle spasms.   multivitamin with minerals Tabs tablet Take 1 tablet by mouth daily.   omeprazole 20 MG capsule Commonly known as:  PRILOSEC Take 20 mg by mouth daily before supper.   OVER THE COUNTER MEDICATION Take 1 tablet by mouth daily. Python otc supplement   oxyCODONE 5 MG immediate release tablet Commonly known as:  Oxy IR/ROXICODONE Take 1-2 tablets (5-10 mg total) by mouth every 4 (four) hours as needed for moderate pain.   polyethylene glycol packet Commonly known as:  MIRALAX / GLYCOLAX Take 17 g by mouth 2 (two) times daily.   tadalafil 5 MG tablet Commonly known as:  CIALIS Take 5 mg by mouth every evening.   Turmeric 500 MG Caps Take 500 mg by mouth daily.   valACYclovir 1000 MG tablet Commonly known as:  VALTREX Take 500 mg by mouth daily.        Signed: [place] Pugh. Nicha Hemann   PA-C  12/28/2016, 5:45 PM

## 2017-02-06 DIAGNOSIS — H40013 Open angle with borderline findings, low risk, bilateral: Secondary | ICD-10-CM | POA: Diagnosis not present

## 2017-02-07 DIAGNOSIS — Z96641 Presence of right artificial hip joint: Secondary | ICD-10-CM | POA: Diagnosis not present

## 2017-02-07 DIAGNOSIS — Z471 Aftercare following joint replacement surgery: Secondary | ICD-10-CM | POA: Diagnosis not present

## 2017-03-08 DIAGNOSIS — Z1211 Encounter for screening for malignant neoplasm of colon: Secondary | ICD-10-CM | POA: Diagnosis not present

## 2017-03-08 DIAGNOSIS — K219 Gastro-esophageal reflux disease without esophagitis: Secondary | ICD-10-CM | POA: Diagnosis not present

## 2017-03-08 DIAGNOSIS — I10 Essential (primary) hypertension: Secondary | ICD-10-CM | POA: Diagnosis not present

## 2017-03-08 DIAGNOSIS — R7303 Prediabetes: Secondary | ICD-10-CM | POA: Diagnosis not present

## 2017-03-08 DIAGNOSIS — Z1159 Encounter for screening for other viral diseases: Secondary | ICD-10-CM | POA: Diagnosis not present

## 2017-03-08 DIAGNOSIS — E7800 Pure hypercholesterolemia, unspecified: Secondary | ICD-10-CM | POA: Diagnosis not present

## 2017-03-08 DIAGNOSIS — Z0000 Encounter for general adult medical examination without abnormal findings: Secondary | ICD-10-CM | POA: Diagnosis not present

## 2017-03-16 DIAGNOSIS — M25562 Pain in left knee: Secondary | ICD-10-CM | POA: Diagnosis not present

## 2017-03-16 DIAGNOSIS — M1712 Unilateral primary osteoarthritis, left knee: Secondary | ICD-10-CM | POA: Diagnosis not present

## 2017-03-16 DIAGNOSIS — M1612 Unilateral primary osteoarthritis, left hip: Secondary | ICD-10-CM | POA: Diagnosis not present

## 2017-03-27 DIAGNOSIS — M9901 Segmental and somatic dysfunction of cervical region: Secondary | ICD-10-CM | POA: Diagnosis not present

## 2017-03-27 DIAGNOSIS — M531 Cervicobrachial syndrome: Secondary | ICD-10-CM | POA: Diagnosis not present

## 2017-03-27 DIAGNOSIS — M9902 Segmental and somatic dysfunction of thoracic region: Secondary | ICD-10-CM | POA: Diagnosis not present

## 2017-03-29 DIAGNOSIS — M1612 Unilateral primary osteoarthritis, left hip: Secondary | ICD-10-CM | POA: Diagnosis not present

## 2017-05-10 DIAGNOSIS — Z23 Encounter for immunization: Secondary | ICD-10-CM | POA: Diagnosis not present

## 2017-05-18 DIAGNOSIS — H40013 Open angle with borderline findings, low risk, bilateral: Secondary | ICD-10-CM | POA: Diagnosis not present

## 2017-05-23 DIAGNOSIS — M9902 Segmental and somatic dysfunction of thoracic region: Secondary | ICD-10-CM | POA: Diagnosis not present

## 2017-05-23 DIAGNOSIS — M531 Cervicobrachial syndrome: Secondary | ICD-10-CM | POA: Diagnosis not present

## 2017-05-23 DIAGNOSIS — M9901 Segmental and somatic dysfunction of cervical region: Secondary | ICD-10-CM | POA: Diagnosis not present

## 2017-07-27 DIAGNOSIS — H40013 Open angle with borderline findings, low risk, bilateral: Secondary | ICD-10-CM | POA: Diagnosis not present

## 2017-08-07 DIAGNOSIS — Z23 Encounter for immunization: Secondary | ICD-10-CM | POA: Diagnosis not present

## 2017-08-08 DIAGNOSIS — M25552 Pain in left hip: Secondary | ICD-10-CM | POA: Diagnosis not present

## 2017-09-01 DIAGNOSIS — M9901 Segmental and somatic dysfunction of cervical region: Secondary | ICD-10-CM | POA: Diagnosis not present

## 2017-09-01 DIAGNOSIS — M531 Cervicobrachial syndrome: Secondary | ICD-10-CM | POA: Diagnosis not present

## 2017-09-01 DIAGNOSIS — M9902 Segmental and somatic dysfunction of thoracic region: Secondary | ICD-10-CM | POA: Diagnosis not present

## 2017-09-05 DIAGNOSIS — M531 Cervicobrachial syndrome: Secondary | ICD-10-CM | POA: Diagnosis not present

## 2017-09-05 DIAGNOSIS — M9901 Segmental and somatic dysfunction of cervical region: Secondary | ICD-10-CM | POA: Diagnosis not present

## 2017-09-05 DIAGNOSIS — M9902 Segmental and somatic dysfunction of thoracic region: Secondary | ICD-10-CM | POA: Diagnosis not present

## 2017-09-11 DIAGNOSIS — I10 Essential (primary) hypertension: Secondary | ICD-10-CM | POA: Diagnosis not present

## 2017-09-11 DIAGNOSIS — K219 Gastro-esophageal reflux disease without esophagitis: Secondary | ICD-10-CM | POA: Diagnosis not present

## 2017-09-11 DIAGNOSIS — R7303 Prediabetes: Secondary | ICD-10-CM | POA: Diagnosis not present

## 2017-09-11 DIAGNOSIS — E7800 Pure hypercholesterolemia, unspecified: Secondary | ICD-10-CM | POA: Diagnosis not present

## 2017-09-14 DIAGNOSIS — H40013 Open angle with borderline findings, low risk, bilateral: Secondary | ICD-10-CM | POA: Diagnosis not present

## 2017-10-23 DIAGNOSIS — N401 Enlarged prostate with lower urinary tract symptoms: Secondary | ICD-10-CM | POA: Diagnosis not present

## 2017-10-30 DIAGNOSIS — R3915 Urgency of urination: Secondary | ICD-10-CM | POA: Diagnosis not present

## 2017-10-30 DIAGNOSIS — N401 Enlarged prostate with lower urinary tract symptoms: Secondary | ICD-10-CM | POA: Diagnosis not present

## 2017-10-31 ENCOUNTER — Ambulatory Visit: Payer: 59 | Admitting: Diagnostic Neuroimaging

## 2017-11-06 ENCOUNTER — Ambulatory Visit: Payer: 59 | Admitting: Diagnostic Neuroimaging

## 2017-11-21 DIAGNOSIS — M25552 Pain in left hip: Secondary | ICD-10-CM | POA: Diagnosis not present

## 2017-12-31 DIAGNOSIS — Z96641 Presence of right artificial hip joint: Secondary | ICD-10-CM | POA: Diagnosis not present

## 2017-12-31 DIAGNOSIS — M1611 Unilateral primary osteoarthritis, right hip: Secondary | ICD-10-CM | POA: Diagnosis not present

## 2017-12-31 DIAGNOSIS — Z471 Aftercare following joint replacement surgery: Secondary | ICD-10-CM | POA: Diagnosis not present

## 2018-01-01 ENCOUNTER — Other Ambulatory Visit: Payer: Self-pay | Admitting: Diagnostic Neuroimaging

## 2018-02-20 DIAGNOSIS — M25552 Pain in left hip: Secondary | ICD-10-CM | POA: Diagnosis not present

## 2018-02-24 ENCOUNTER — Other Ambulatory Visit: Payer: Self-pay | Admitting: Diagnostic Neuroimaging

## 2018-02-25 NOTE — Telephone Encounter (Signed)
Gabapentin refilled x 3 months. Patient has FU in Sept.

## 2018-03-01 DIAGNOSIS — M9902 Segmental and somatic dysfunction of thoracic region: Secondary | ICD-10-CM | POA: Diagnosis not present

## 2018-03-01 DIAGNOSIS — M9903 Segmental and somatic dysfunction of lumbar region: Secondary | ICD-10-CM | POA: Diagnosis not present

## 2018-03-01 DIAGNOSIS — M9905 Segmental and somatic dysfunction of pelvic region: Secondary | ICD-10-CM | POA: Diagnosis not present

## 2018-03-14 DIAGNOSIS — I10 Essential (primary) hypertension: Secondary | ICD-10-CM | POA: Diagnosis not present

## 2018-03-14 DIAGNOSIS — K219 Gastro-esophageal reflux disease without esophagitis: Secondary | ICD-10-CM | POA: Diagnosis not present

## 2018-03-14 DIAGNOSIS — R7303 Prediabetes: Secondary | ICD-10-CM | POA: Diagnosis not present

## 2018-03-14 DIAGNOSIS — Z0000 Encounter for general adult medical examination without abnormal findings: Secondary | ICD-10-CM | POA: Diagnosis not present

## 2018-03-14 DIAGNOSIS — Z1211 Encounter for screening for malignant neoplasm of colon: Secondary | ICD-10-CM | POA: Diagnosis not present

## 2018-03-14 DIAGNOSIS — E7800 Pure hypercholesterolemia, unspecified: Secondary | ICD-10-CM | POA: Diagnosis not present

## 2018-03-15 DIAGNOSIS — H40053 Ocular hypertension, bilateral: Secondary | ICD-10-CM | POA: Diagnosis not present

## 2018-04-01 ENCOUNTER — Ambulatory Visit: Payer: 59 | Admitting: Diagnostic Neuroimaging

## 2018-04-01 ENCOUNTER — Encounter

## 2018-04-01 ENCOUNTER — Encounter: Payer: Self-pay | Admitting: Diagnostic Neuroimaging

## 2018-04-01 VITALS — BP 106/71 | HR 81 | Ht 72.0 in | Wt 221.0 lb

## 2018-04-01 DIAGNOSIS — G8929 Other chronic pain: Secondary | ICD-10-CM

## 2018-04-01 DIAGNOSIS — M25512 Pain in left shoulder: Secondary | ICD-10-CM

## 2018-04-01 DIAGNOSIS — R200 Anesthesia of skin: Secondary | ICD-10-CM | POA: Diagnosis not present

## 2018-04-01 NOTE — Progress Notes (Signed)
[facility]  PATIENT: Robert Carroll DOB: [birth year]  REFERRING CLINICIAN:  HISTORY FROM: patient  REASON FOR VISIT: follow up    HISTORICAL  CHIEF COMPLAINT:  Chief Complaint  Patient presents with  . Follow-up  . L arm numbness    doing well. Asking about decreasing dose.     HISTORY OF PRESENT ILLNESS:   UPDATE (04/01/18, VRP): Since last visit, doing well. Symptoms are resolved. Severity is mild. No alleviating or aggravating factors. Tolerating gabapentin.    UPDATE 10/31/16: Since last visit, doing better on gabapentin 900mg  three times a day. Pain is much reduced. Attacks are milder and less frequent. Able to exercise better now.   UPDATE 08/02/16: Since last visit, was doing well until December 2017, then more intense symptoms and flare up. In the left arm there is the muscle tightness/numbness sensation. Sxs continue on daily basis; 2-4 per hour at least; lasting up to 1 minute at a time. Sometimes there is post-ictal weakness.   UPDATE 04/14/16: Since last visit, doing better now. Now going to [facility], using sauna, going to chiropractor. Symptoms are less frequent and less severe. Test results normal.   PRIOR HPI (03/03/16): 62 year old right-handed male here for evaluation of left upper extremity numbness and tingling. May 2017 patient had onset of left shoulder numbness and tingling sensation which would slowly moved down his arm into his fingers. The progression would slowly occur over 30-60 seconds and then resolve. No specific triggering factors. Patient was having multiple attacks per day, up to 3-4/hour. Initial attacks or sometimes associated with mild twitching of his left hand and fingers. Patient had some left axillary pain sensation, history of right shoulder problem, and therefore followed up with his orthopedic surgeon. Patient was evaluated for possible impingement syndrome of the left shoulder and treated with trigger point injection. Patient  referred to me for evaluation and consideration of alternate explanation of the symptoms. Over time the attacks have continued although slightly decreased in intensity and frequency. Patient denies any specific neck pain. No prodromal injuries, accidents, traumas. No history of seizure, TIA or stroke. No family history of seizure. Patient denies any left face or left leg numbness or tingling. No loss of consciousness or altered consciousness attacks.   REVIEW OF SYSTEMS: Full 14 system review of systems performed and negative with exception of: only as per HPI.    ALLERGIES: No Known Allergies  HOME MEDICATIONS: Outpatient Medications Prior to Visit  Medication Sig Dispense Refill  . acetaminophen (TYLENOL) 325 MG tablet Take 2 tablets (650 mg total) by mouth every 4 (four) hours as needed for mild pain, fever or headache.    Marland Kitchen amLODipine-benazepril (LOTREL) 5-20 MG per capsule Take 1 capsule by mouth daily.     Marland Kitchen atorvastatin (LIPITOR) 20 MG tablet Take 20 mg by mouth every evening.     . cabergoline (DOSTINEX) 0.5 MG tablet Take 0.25 mg by mouth 2 (two) times a week.     . doxazosin (CARDURA) 4 MG tablet Take 4 mg by mouth at bedtime.     . fluticasone (FLONASE) 50 MCG/ACT nasal spray Place 1 spray into both nostrils daily as needed for allergies or rhinitis.    Marland Kitchen gabapentin (NEURONTIN) 300 MG capsule TAKE 3 CAPSULES BY MOUTH 3  TIMES DAILY 810 capsule 0  . loratadine (CLARITIN) 10 MG tablet Take 10 mg by mouth daily.    . Multiple Vitamin (MULTIVITAMIN WITH MINERALS) TABS tablet Take 1 tablet by mouth daily.    Marland Kitchen  Omega-3 Fatty Acids (FISH OIL) 1000 MG CAPS Take 1,000 mg by mouth daily at 3 pm.    . omeprazole (PRILOSEC) 20 MG capsule Take 20 mg by mouth daily before supper.     Marland Kitchen OVER THE COUNTER MEDICATION Taking once daily for allergies    . tadalafil (CIALIS) 5 MG tablet Take 5 mg by mouth every evening.     . valACYclovir (VALTREX) 1000 MG tablet Take 500 mg by mouth daily.     .  Ginkgo Biloba 120 MG CAPS Take 120 mg by mouth daily.    . Glucosamine 500 MG CAPS Take 500 mg by mouth daily.    Marland Kitchen docusate sodium (COLACE) 100 MG capsule Take 1 capsule (100 mg total) by mouth 2 (two) times daily. (Patient not taking: Reported on 04/01/2018) 10 capsule 0  . ferrous sulfate (FERROUSUL) 325 (65 FE) MG tablet Take 1 tablet (325 mg total) by mouth 3 (three) times daily with meals. (Patient not taking: Reported on 04/01/2018)    . methocarbamol (ROBAXIN) 500 MG tablet Take 1 tablet (500 mg total) by mouth every 6 (six) hours as needed for muscle spasms. (Patient not taking: Reported on 04/01/2018) 40 tablet 0  . OVER THE COUNTER MEDICATION Take 1 tablet by mouth daily. Python otc supplement    . oxyCODONE (OXY IR/ROXICODONE) 5 MG immediate release tablet Take 1-2 tablets (5-10 mg total) by mouth every 4 (four) hours as needed for moderate pain. (Patient not taking: Reported on 04/01/2018) 60 tablet 0  . polyethylene glycol (MIRALAX / GLYCOLAX) packet Take 17 g by mouth 2 (two) times daily. (Patient not taking: Reported on 04/01/2018) 14 each 0  . Turmeric 500 MG CAPS Take 500 mg by mouth daily.     Facility-Administered Medications Prior to Visit  Medication Dose Route Frequency Provider Last Rate Last Dose  . gadopentetate dimeglumine (MAGNEVIST) injection 20 mL  20 mL Intravenous Once PRN Penumalli, Earlean Polka, MD        PAST MEDICAL HISTORY: Past Medical History:  Diagnosis Date  . Colon polyp    adenomatous, 2008  . Genital herpes   . Hyperlipidemia   . Hypertension     PAST SURGICAL HISTORY: Past Surgical History:  Procedure Laterality Date  . BUNIONECTOMY Bilateral   . COLONOSCOPY   2013  . EYE SURGERY     LASIK 2 YEARS AGO   . Daisy   right  . TOTAL HIP ARTHROPLASTY Right 12/26/2016   Procedure: RIGHT TOTAL HIP ARTHROPLASTY ANTERIOR APPROACH;  Surgeon: Paralee Cancel, MD;  Location: [facility];  Service: Orthopedics;  Laterality: Right;    FAMILY  HISTORY: Family History  Problem Relation Age of Onset  . Heart disease Mother   . Stroke Mother   . Heart disease Father   . Hypertension Father   . Diabetes Father   . Heart disease Sister   . Lupus Sister   . Heart disease Brother   . Hypertension Brother   . Diabetes Brother   . Heart disease Maternal Aunt   . Heart disease Maternal Uncle   . Cancer Maternal Uncle        prostate  . Cancer Paternal Uncle        prostate  . Colon cancer Neg Hx   . Stomach cancer Neg Hx     SOCIAL HISTORY:  Social History   Socioeconomic History  . Marital status: Married    Spouse name: Not on file  .  Number of children: 3  . Years of education: 74  . Highest education level: Not on file  Occupational History    Comment: [employer]  Social Needs  . Financial resource strain: Not on file  . Food insecurity:    Worry: Not on file    Inability: Not on file  . Transportation needs:    Medical: Not on file    Non-medical: Not on file  Tobacco Use  . Smoking status: Never Smoker  . Smokeless tobacco: Never Used  Substance and Sexual Activity  . Alcohol use: Yes    Alcohol/week: 8.0 standard drinks    Types: 4 Standard drinks or equivalent, 4 Cans of beer per week    Comment: MIXED DRINK/COCKTAIL 5 weekly  . Drug use: No  . Sexual activity: Not on file  Lifestyle  . Physical activity:    Days per week: Not on file    Minutes per session: Not on file  . Stress: Not on file  Relationships  . Social connections:    Talks on phone: Not on file    Gets together: Not on file    Attends religious service: Not on file    Active member of club or organization: Not on file    Attends meetings of clubs or organizations: Not on file    Relationship status: Not on file  . Intimate partner violence:    Fear of current or ex partner: Not on file    Emotionally abused: Not on file    Physically abused: Not on file    Forced sexual activity: Not on file    Other Topics Concern  . Not on file  Social History Narrative  . Not on file     PHYSICAL EXAM  GENERAL EXAM/CONSTITUTIONAL: Vitals:  Vitals:   04/01/18 1257  BP: 106/71  Pulse: 81  Weight: 221 lb (100.2 kg)  Height: 6' (1.829 m)     Body mass index is 29.97 kg/m. Wt Readings from Last 3 Encounters:  04/01/18 221 lb (100.2 kg)  12/26/16 220 lb (99.8 kg)  12/19/16 220 lb 3.2 oz (99.9 kg)     Patient is in no distress; well developed, nourished and groomed; neck is supple  CARDIOVASCULAR:  Examination of carotid arteries is normal; no carotid bruits  Regular rate and rhythm, no murmurs  Examination of peripheral vascular system by observation and palpation is normal  EYES:  Ophthalmoscopic exam of optic discs and posterior segments is normal; no papilledema or hemorrhages  No exam data present  MUSCULOSKELETAL:  Gait, strength, tone, movements noted in Neurologic exam below  NEUROLOGIC: MENTAL STATUS:  No flowsheet data found.  awake, alert, oriented to person, place and time  recent and remote memory intact  normal attention and concentration  language fluent, comprehension intact, naming intact  fund of knowledge appropriate  CRANIAL NERVE:   2nd - no papilledema on fundoscopic exam  2nd, 3rd, 4th, 6th - pupils equal and reactive to light, visual fields full to confrontation, extraocular muscles intact, no nystagmus  5th - facial sensation symmetric  7th - facial strength symmetric  8th - hearing intact  9th - palate elevates symmetrically, uvula midline  11th - shoulder shrug symmetric  12th - tongue protrusion midline  MOTOR:   normal bulk and tone, full strength in the BUE, BLE  SENSORY:   normal and symmetric to light touch  COORDINATION:   finger-nose-finger, fine finger movements normal  REFLEXES:   deep  tendon reflexes TRACE and symmetric  GAIT/STATION:   narrow based gait     DIAGNOSTIC DATA (LABS,  IMAGING, TESTING) - I reviewed patient records, labs, notes, testing and imaging myself where available.  Lab Results  Component Value Date   WBC 9.5 12/27/2016   HGB 11.8 (L) 12/27/2016   HCT 34.5 (L) 12/27/2016   MCV 90.8 12/27/2016   PLT 126 (L) 12/27/2016      Component Value Date/Time   NA 139 12/27/2016 0435   K 4.0 12/27/2016 0435   CL 110 12/27/2016 0435   CO2 24 12/27/2016 0435   GLUCOSE 146 (H) 12/27/2016 0435   BUN 16 12/27/2016 0435   CREATININE 0.96 12/27/2016 0435   CALCIUM 8.2 (L) 12/27/2016 0435   GFRNONAA >60 12/27/2016 0435   GFRAA >60 12/27/2016 0435   No results found for: CHOL, HDL, LDLCALC, LDLDIRECT, TRIG, CHOLHDL No results found for: HGBA1C No results found for: VITAMINB12 No results found for: TSH   03/22/16 MRI brain [I reviewed images myself and agree with interpretation. -VRP]  - normal  04/07/16 EEG [I reviewed images myself and agree with interpretation. -VRP]  - normal  03/10/16 Carotid u/s - normal    ASSESSMENT AND PLAN  62 y.o. year old male here with new onset intermittent attacks of left upper extremity numbness and tingling, with slow progression from left shoulder to the left hand and fingers. The patient's description is highly suspicious for jacksonian march and partial seizure events, but MRI brain and EEG were normal (including pt report of having 1 event during EEG). Left cervical radiculopathy or left arm peripheral neuropathy are other considerations but less likely given the repetitive, stereotypical nature of these episodes/attacks. MRI cervical was ordered but denied by insurance. Now symptoms more likely related to partial seizure cause.   Localization: right brain (parietal), left cervical nerve roots, left arm peripheral nerves  Ddx: partial seizure, left cervical radiculopathy, left arm peripheral neuropathy  1. Left arm numbness   2. Chronic left shoulder pain     PLAN:  I spent 15 minutes of face to face time  with patient. Greater than 50% of time was spent in counseling and coordination of care with patient. This is necessary because patient's symptoms are not optimally controlled / improved. In summary we discussed: - Diagnostic results, impressions, or recommended diagnostic studies: partial seizure vs neuropathy - Prognosis: goo - Risks and benefits of management (treatment) options: continue on gabapentin  - stable; continue gabapentin 900mg  three times a day (may be helping with partial seizures; may be helping with peripheral nerve issues) - will request PCP to refill gabapentin going forward  Return for return to PCP.     Penni Bombard, MD 10/22/2876, 6:76 PM Certified in Neurology, Neurophysiology and Neuroimaging  [facility] [address] [place],  [address] [phone]

## 2018-05-13 DIAGNOSIS — R740 Nonspecific elevation of levels of transaminase and lactic acid dehydrogenase [LDH]: Secondary | ICD-10-CM | POA: Diagnosis not present

## 2018-05-13 DIAGNOSIS — R7989 Other specified abnormal findings of blood chemistry: Secondary | ICD-10-CM | POA: Diagnosis not present

## 2018-06-28 DIAGNOSIS — H40053 Ocular hypertension, bilateral: Secondary | ICD-10-CM | POA: Diagnosis not present

## 2018-07-10 DIAGNOSIS — H6123 Impacted cerumen, bilateral: Secondary | ICD-10-CM | POA: Diagnosis not present

## 2018-07-10 DIAGNOSIS — J310 Chronic rhinitis: Secondary | ICD-10-CM | POA: Diagnosis not present

## 2018-07-10 DIAGNOSIS — G4733 Obstructive sleep apnea (adult) (pediatric): Secondary | ICD-10-CM | POA: Diagnosis not present

## 2018-07-12 DIAGNOSIS — H9202 Otalgia, left ear: Secondary | ICD-10-CM | POA: Diagnosis not present

## 2018-07-12 DIAGNOSIS — H6993 Unspecified Eustachian tube disorder, bilateral: Secondary | ICD-10-CM | POA: Diagnosis not present

## 2018-07-19 DIAGNOSIS — G4730 Sleep apnea, unspecified: Secondary | ICD-10-CM | POA: Diagnosis not present

## 2018-09-03 DIAGNOSIS — M1611 Unilateral primary osteoarthritis, right hip: Secondary | ICD-10-CM | POA: Diagnosis not present

## 2018-09-03 DIAGNOSIS — G4733 Obstructive sleep apnea (adult) (pediatric): Secondary | ICD-10-CM | POA: Diagnosis not present

## 2018-09-04 NOTE — Progress Notes (Signed)
Need preop orders in epic.  Preop on 09/09/2018.

## 2018-09-06 NOTE — Progress Notes (Signed)
08/19/2018- Medical Clearance on chart from Dr. Moreen Fowler  03/14/2018- Office note and labs from Dr. Moreen Fowler on chart.  Labs-Lipid panel, CMP, HgA1C, CBC w/diff., TSH, FOBT FIT

## 2018-09-06 NOTE — Patient Instructions (Signed)
Robert Carroll  09/06/2018   Your procedure is scheduled on: Tuesday 09/17/2018  Report to [facility] Main  Entrance              Report to admitting at  0610  AM    Call this number if you have problems the morning of surgery [phone]    Remember: Do not eat food or drink liquids :After Midnight.              BRUSH YOUR TEETH MORNING OF SURGERY AND RINSE YOUR MOUTH OUT, NO CHEWING GUM CANDY OR MINTS.     Take these medicines the morning of surgery with A SIP OF WATER: Gabapentin (Neurontin), Atorvastatin (Lipitor), Doxazosin (Cardura)                                You may not have any metal on your body including hair pins and              piercings  Do not wear jewelry, make-up, lotions, powders or perfumes, deodorant              Men may shave face and neck.   Do not bring valuables to the hospital. [place].  Contacts, dentures or bridgework may not be worn into surgery.  Leave suitcase in the car. After surgery it may be brought to your room.                  Please read over the following fact sheets you were given: _____________________________________________________________________             [facility] - Preparing for Surgery Before surgery, you can play an important role.  Because skin is not sterile, your skin needs to be as free of germs as possible.  You can reduce the number of germs on your skin by washing with CHG (chlorahexidine gluconate) soap before surgery.  CHG is an antiseptic cleaner which kills germs and bonds with the skin to continue killing germs even after washing. Please DO NOT use if you have an allergy to CHG or antibacterial soaps.  If your skin becomes reddened/irritated stop using the CHG and inform your nurse when you arrive at Short Stay. Do not shave (including legs and underarms) for at least 48 hours prior to the first CHG shower.  You may shave your  face/neck. Please follow these instructions carefully:  1.  Shower with CHG Soap the night before surgery and the  morning of Surgery.  2.  If you choose to wash your hair, wash your hair first as usual with your  normal  shampoo.  3.  After you shampoo, rinse your hair and body thoroughly to remove the  shampoo.                           4.  Use CHG as you would any other liquid soap.  You can apply chg directly  to the skin and wash                       Gently with a scrungie or clean washcloth.  5.  Apply the CHG Soap to your  body ONLY FROM THE NECK DOWN.   Do not use on face/ open                           Wound or open sores. Avoid contact with eyes, ears mouth and genitals (private parts).                       Wash face,  Genitals (private parts) with your normal soap.             6.  Wash thoroughly, paying special attention to the area where your surgery  will be performed.  7.  Thoroughly rinse your body with warm water from the neck down.  8.  DO NOT shower/wash with your normal soap after using and rinsing off  the CHG Soap.                9.  Pat yourself dry with a clean towel.            10.  Wear clean pajamas.            11.  Place clean sheets on your bed the night of your first shower and do not  sleep with pets. Day of Surgery : Do not apply any lotions/deodorants the morning of surgery.  Please wear clean clothes to the hospital/surgery center.  FAILURE TO FOLLOW THESE INSTRUCTIONS MAY RESULT IN THE CANCELLATION OF YOUR SURGERY PATIENT SIGNATURE_________________________________  NURSE SIGNATURE__________________________________  ________________________________________________________________________   [name]  An incentive spirometer is a tool that can help keep your lungs clear and active. This tool measures how well you are filling your lungs with each breath. Taking long deep breaths may help reverse or decrease the chance of developing breathing  (pulmonary) problems (especially infection) following:  A long period of time when you are unable to move or be active. BEFORE THE PROCEDURE   If the spirometer includes an indicator to show your best effort, your nurse or respiratory therapist will set it to a desired goal.  If possible, sit up straight or lean slightly forward. Try not to slouch.  Hold the incentive spirometer in an upright position. INSTRUCTIONS FOR USE  1. Sit on the edge of your bed if possible, or sit up as far as you can in bed or on a chair. 2. Hold the incentive spirometer in an upright position. 3. Breathe out normally. 4. Place the mouthpiece in your mouth and seal your lips tightly around it. 5. Breathe in slowly and as deeply as possible, raising the piston or the ball toward the top of the column. 6. Hold your breath for 3-5 seconds or for as long as possible. Allow the piston or ball to fall to the bottom of the column. 7. Remove the mouthpiece from your mouth and breathe out normally. 8. Rest for a few seconds and repeat Steps 1 through 7 at least 10 times every 1-2 hours when you are awake. Take your time and take a few normal breaths between deep breaths. 9. The spirometer may include an indicator to show your best effort. Use the indicator as a goal to work toward during each repetition. 10. After each set of 10 deep breaths, practice coughing to be sure your lungs are clear. If you have an incision (the cut made at the time of surgery), support your incision when coughing by placing a pillow or rolled up towels firmly against it.  Once you are able to get out of bed, walk around indoors and cough well. You may stop using the incentive spirometer when instructed by your caregiver.  RISKS AND COMPLICATIONS  Take your time so you do not get dizzy or light-headed.  If you are in pain, you may need to take or ask for pain medication before doing incentive spirometry. It is harder to take a deep breath if you  are having pain. AFTER USE  Rest and breathe slowly and easily.  It can be helpful to keep track of a log of your progress. Your caregiver can provide you with a simple table to help with this. If you are using the spirometer at home, follow these instructions: [place] IF:   You are having difficultly using the spirometer.  You have trouble using the spirometer as often as instructed.  Your pain medication is not giving enough relief while using the spirometer.  You develop fever of 100.5 F (38.1 C) or higher. SEEK IMMEDIATE MEDICAL CARE IF:   You cough up bloody sputum that had not been present before.  You develop fever of 102 F (38.9 C) or greater.  You develop worsening pain at or near the incision site. MAKE SURE YOU:   Understand these instructions.  Will watch your condition.  Will get help right away if you are not doing well or get worse. Document Released: 11/20/2006 Document Revised: 10/02/2011 Document Reviewed: 01/21/2007 ExitCare Patient Information 2014 ExitCare, [place].   ________________________________________________________________________  WHAT IS A BLOOD TRANSFUSION? Blood Transfusion Information  A transfusion is the replacement of blood or some of its parts. Blood is made up of multiple cells which provide different functions.  Red blood cells carry oxygen and are used for blood loss replacement.  White blood cells fight against infection.  Platelets control bleeding.  Plasma helps clot blood.  Other blood products are available for specialized needs, such as hemophilia or other clotting disorders. BEFORE THE TRANSFUSION  Who gives blood for transfusions?   Healthy volunteers who are fully evaluated to make sure their blood is safe. This is blood bank blood. Transfusion therapy is the safest it has ever been in the practice of medicine. Before blood is taken from a donor, a complete history is taken to make sure that person has  no history of diseases nor engages in risky social behavior (examples are intravenous drug use or sexual activity with multiple partners). The donor's travel history is screened to minimize risk of transmitting infections, such as malaria. The donated blood is tested for signs of infectious diseases, such as HIV and hepatitis. The blood is then tested to be sure it is compatible with you in order to minimize the chance of a transfusion reaction. If you or a relative donates blood, this is often done in anticipation of surgery and is not appropriate for emergency situations. It takes many days to process the donated blood. RISKS AND COMPLICATIONS Although transfusion therapy is very safe and saves many lives, the main dangers of transfusion include:   Getting an infectious disease.  Developing a transfusion reaction. This is an allergic reaction to something in the blood you were given. Every precaution is taken to prevent this. The decision to have a blood transfusion has been considered carefully by your caregiver before blood is given. Blood is not given unless the benefits outweigh the risks. AFTER THE TRANSFUSION  Right after receiving a blood transfusion, you will usually feel much better and more energetic.  This is especially true if your red blood cells have gotten low (anemic). The transfusion raises the level of the red blood cells which carry oxygen, and this usually causes an energy increase.  The nurse administering the transfusion will monitor you carefully for complications. HOME CARE INSTRUCTIONS  No special instructions are needed after a transfusion. You may find your energy is better. Speak with your caregiver about any limitations on activity for underlying diseases you may have. SEEK MEDICAL CARE IF:   Your condition is not improving after your transfusion.  You develop redness or irritation at the intravenous (IV) site. SEEK IMMEDIATE MEDICAL CARE IF:  Any of the following  symptoms occur over the next 12 hours:  Shaking chills.  You have a temperature by mouth above 102 F (38.9 C), not controlled by medicine.  Chest, back, or muscle pain.  People around you feel you are not acting correctly or are confused.  Shortness of breath or difficulty breathing.  Dizziness and fainting.  You get a rash or develop hives.  You have a decrease in urine output.  Your urine turns a dark color or changes to pink, red, or brown. Any of the following symptoms occur over the next 10 days:  You have a temperature by mouth above 102 F (38.9 C), not controlled by medicine.  Shortness of breath.  Weakness after normal activity.  The white part of the eye turns yellow (jaundice).  You have a decrease in the amount of urine or are urinating less often.  Your urine turns a dark color or changes to pink, red, or brown. Document Released: 07/07/2000 Document Revised: 10/02/2011 Document Reviewed: 02/24/2008 [facility] Patient Information 2014 [place], [place].  _______________________________________________________________________

## 2018-09-09 ENCOUNTER — Other Ambulatory Visit: Payer: Self-pay

## 2018-09-09 ENCOUNTER — Encounter (HOSPITAL_COMMUNITY)
Admission: RE | Admit: 2018-09-09 | Discharge: 2018-09-09 | Disposition: A | Discharge status: Home / Self Care | Payer: 59 | Source: Ambulatory Visit | Attending: Orthopedic Surgery | Admitting: Orthopedic Surgery

## 2018-09-09 ENCOUNTER — Encounter (HOSPITAL_COMMUNITY): Payer: Self-pay

## 2018-09-09 DIAGNOSIS — Z01818 Encounter for other preprocedural examination: Secondary | ICD-10-CM | POA: Diagnosis not present

## 2018-09-09 DIAGNOSIS — I10 Essential (primary) hypertension: Secondary | ICD-10-CM | POA: Insufficient documentation

## 2018-09-09 DIAGNOSIS — M1612 Unilateral primary osteoarthritis, left hip: Secondary | ICD-10-CM | POA: Insufficient documentation

## 2018-09-09 HISTORY — DX: Sleep apnea, unspecified: G47.30

## 2018-09-09 HISTORY — DX: Unspecified osteoarthritis, unspecified site: M19.90

## 2018-09-09 LAB — BASIC METABOLIC PANEL
Anion gap: 10 (ref 5–15)
BUN: 16 mg/dL (ref 8–23)
CHLORIDE: 108 mmol/L (ref 98–111)
CO2: 21 mmol/L — ABNORMAL LOW (ref 22–32)
Calcium: 9.4 mg/dL (ref 8.9–10.3)
Creatinine, Ser: 0.93 mg/dL (ref 0.61–1.24)
GFR calc Af Amer: >60 mL/min (ref >60)
GFR calc non Af Amer: >60 mL/min (ref >60)
Glucose, Bld: 103 mg/dL — ABNORMAL HIGH (ref 70–99)
Potassium: 3.9 mmol/L (ref 3.5–5.1)
Sodium: 139 mmol/L (ref 135–145)

## 2018-09-09 LAB — CBC
HEMATOCRIT: 42.1 % (ref 39.0–52.0)
Hemoglobin: 13.9 g/dL (ref 13.0–17.0)
MCH: 30.4 pg (ref 26.0–34.0)
MCHC: 33 g/dL (ref 30.0–36.0)
MCV: 92.1 fL (ref 80.0–100.0)
Platelets: 168 10*3/uL (ref 150–400)
RBC: 4.57 MIL/uL (ref 4.22–5.81)
RDW: 12.9 % (ref 11.5–15.5)
WBC: 2.4 10*3/uL — ABNORMAL LOW (ref 4.0–10.5)
nRBC: 0 % (ref 0.0–0.2)

## 2018-09-09 LAB — SURGICAL PCR SCREEN
MRSA, PCR: NEGATIVE
Staphylococcus aureus: NEGATIVE

## 2018-09-11 NOTE — H&P (Signed)
TOTAL HIP ADMISSION H&P  Patient is admitted for left total hip arthroplasty, anterior approach.  Subjective:  Chief Complaint:   Left hip primary OA / pain  HPI: Robert Carroll, 63 y.o. male, has a history of pain and functional disability in the left hip(s) due to arthritis and patient has failed non-surgical conservative treatments for greater than 12 weeks to include NSAID's and/or analgesics, corticosteriod injections and activity modification.  Onset of symptoms was gradual starting 2+ years ago with gradually worsening course since that time.The patient noted prior procedures of the hip to include arthroplasty on the right hip in June 2018 per Dr. Alvan Dame.  Patient currently rates pain in the left hip at 8 out of 10 with activity. Patient has night pain, worsening of pain with activity and weight bearing, trendelenberg gait, pain that interfers with activities of daily living and pain with passive range of motion. Patient has evidence of periarticular osteophytes and joint space narrowing by imaging studies. This condition presents safety issues increasing the risk of falls.  There is no current active infection.  Risks, benefits and expectations were discussed with the patient.  Risks including but not limited to the risk of anesthesia, blood clots, nerve damage, blood vessel damage, failure of the prosthesis, infection and up to and including death.  Patient understand the risks, benefits and expectations and wishes to proceed with surgery.   PCP: Antony Contras, MD  D/C Plans:       Home  Post-op Meds:       No Rx given  Tranexamic Acid:      To be given - IV   Decadron:      Is to be given  FYI:      ASA  Norco  CPAP  DME:   Pt already has equipment   PT:    No PT     Patient Active Problem List   Diagnosis Date Noted  . Overweight (BMI 25.0-29.9) 12/27/2016  . S/P right THA, AA 12/26/2016   Past Medical History:  Diagnosis Date  . Arthritis   . Colon polyp    adenomatous,  2008  . Genital herpes   . Hyperlipidemia   . Hypertension   . Sleep apnea    on CPAP    Past Surgical History:  Procedure Laterality Date  . BUNIONECTOMY Bilateral   . COLONOSCOPY   2013  . EYE SURGERY     LASIK 2 YEARS AGO   . [place]   right  . TOTAL HIP ARTHROPLASTY Right 12/26/2016   Procedure: RIGHT TOTAL HIP ARTHROPLASTY ANTERIOR APPROACH;  Surgeon: Paralee Cancel, MD;  Location: [facility] ORS;  Service: Orthopedics;  Laterality: Right;    No current facility-administered medications for this encounter.    Current Outpatient Medications  Medication Sig Dispense Refill Last Dose  . acetaminophen (TYLENOL) 500 MG tablet Take 1,000 mg by mouth every 6 (six) hours as needed for moderate pain.     Marland Kitchen amLODipine-benazepril (LOTREL) 10-20 MG capsule Take 1 capsule by mouth at bedtime.    Taking  . aspirin EC 81 MG tablet Take 81 mg by mouth daily.     Marland Kitchen atorvastatin (LIPITOR) 20 MG tablet Take 20 mg by mouth daily.    Taking  . cabergoline (DOSTINEX) 0.5 MG tablet Take 0.25 mg by mouth 2 (two) times a week. Monday and Friday morning   Taking  . diphenhydrAMINE (BENADRYL) 25 MG tablet Take 25 mg by mouth daily.     Marland Kitchen  doxazosin (CARDURA) 4 MG tablet Take 4 mg by mouth daily.    Taking  . fluticasone (FLONASE) 50 MCG/ACT nasal spray Place 2 sprays into both nostrils daily.    Taking  . gabapentin (NEURONTIN) 300 MG capsule TAKE 3 CAPSULES BY MOUTH 3  TIMES DAILY (Patient taking differently: Take 900 mg by mouth 3 (three) times daily. ) 810 capsule 0 Taking  . meloxicam (MOBIC) 15 MG tablet Take 15 mg by mouth daily.     . Multiple Vitamin (MULTIVITAMIN WITH MINERALS) TABS tablet Take 1 tablet by mouth daily.   Taking  . Omega-3 Fatty Acids (FISH OIL) 1000 MG CAPS Take 1,000 mg by mouth daily.    Taking  . omeprazole (PRILOSEC) 20 MG capsule Take 20 mg by mouth daily before supper.    Taking  . tadalafil (CIALIS) 5 MG tablet Take 5 mg by mouth daily.    Taking  .  valACYclovir (VALTREX) 1000 MG tablet Take 500 mg by mouth daily.    Taking  . acetaminophen (TYLENOL) 325 MG tablet Take 2 tablets (650 mg total) by mouth every 4 (four) hours as needed for mild pain, fever or headache.   Not Taking at Unknown time   Facility-Administered Medications Ordered in Other Encounters  Medication Dose Route Frequency Provider Last Rate Last Dose  . gadopentetate dimeglumine (MAGNEVIST) injection 20 mL  20 mL Intravenous Once PRN Penumalli, Earlean Polka, MD       No Known Allergies   Social History   Tobacco Use  . Smoking status: Never Smoker  . Smokeless tobacco: Never Used  Substance Use Topics  . Alcohol use: Yes    Alcohol/week: 8.0 standard drinks    Types: 4 Standard drinks or equivalent, 4 Cans of beer per week    Comment: MIXED DRINK/COCKTAIL 5 weekly    Family History  Problem Relation Age of Onset  . Heart disease Mother   . Stroke Mother   . Heart disease Father   . Hypertension Father   . Diabetes Father   . Heart disease Sister   . Lupus Sister   . Heart disease Brother   . Hypertension Brother   . Diabetes Brother   . Heart disease Maternal Aunt   . Heart disease Maternal Uncle   . Cancer Maternal Uncle        prostate  . Cancer Paternal Uncle        prostate  . Colon cancer Neg Hx   . Stomach cancer Neg Hx      Review of Systems  Constitutional: Negative.   HENT: Negative.   Eyes: Negative.   Respiratory: Negative.   Cardiovascular: Negative.   Gastrointestinal: Positive for heartburn.  Genitourinary: Negative.   Musculoskeletal: Positive for joint pain.  Skin: Negative.   Neurological: Negative.   Endo/Heme/Allergies: Negative.   Psychiatric/Behavioral: Negative.     Objective:  Physical Exam  Constitutional: He is oriented to person, place, and time. He appears well-developed.  HENT:  Head: Normocephalic.  Eyes: Pupils are equal, round, and reactive to light.  Neck: Neck supple. No JVD present. No tracheal  deviation present. No thyromegaly present.  Cardiovascular: Normal rate, regular rhythm and intact distal pulses.  Respiratory: Effort normal and breath sounds normal. No respiratory distress. He has no wheezes.  GI: Soft. There is no abdominal tenderness. There is no guarding.  Musculoskeletal:     Left hip: He exhibits decreased range of motion, decreased strength, tenderness and bony tenderness. He exhibits no  swelling, no deformity and no laceration.  Lymphadenopathy:    He has no cervical adenopathy.  Neurological: He is alert and oriented to person, place, and time.  Skin: Skin is warm and dry.  Psychiatric: He has a normal mood and affect.      Labs:  Estimated body mass index is 29.97 kg/m as calculated from the following:   Height as of 09/09/18: 6' (1.829 m).   Weight as of 04/01/18: 100.2 kg.   Imaging Review Plain radiographs demonstrate severe degenerative joint disease of the left hip. The bone quality appears to be good for age and reported activity level.    Assessment/Plan:  End stage arthritis, left hip  The patient history, physical examination, clinical judgement of the provider and imaging studies are consistent with end stage degenerative joint disease of the left hip and total hip arthroplasty is deemed medically necessary. The treatment options including medical management, injection therapy, arthroscopy and arthroplasty were discussed at length. The risks and benefits of total hip arthroplasty were presented and reviewed. The risks due to aseptic loosening, infection, stiffness, dislocation/subluxation,  thromboembolic complications and other imponderables were discussed.  The patient acknowledged the explanation, agreed to proceed with the plan and consent was signed. Patient is being admitted for inpatient treatment for surgery, pain control, PT, OT, prophylactic antibiotics, VTE prophylaxis, progressive ambulation and ADL's and discharge planning.The patient  is planning to be discharged home.    [place] Kielan Dreisbach   PA-C  09/11/2018, 4:46 PM

## 2018-09-12 DIAGNOSIS — R7303 Prediabetes: Secondary | ICD-10-CM | POA: Diagnosis not present

## 2018-09-12 DIAGNOSIS — E7800 Pure hypercholesterolemia, unspecified: Secondary | ICD-10-CM | POA: Diagnosis not present

## 2018-09-12 DIAGNOSIS — I10 Essential (primary) hypertension: Secondary | ICD-10-CM | POA: Diagnosis not present

## 2018-09-12 DIAGNOSIS — K219 Gastro-esophageal reflux disease without esophagitis: Secondary | ICD-10-CM | POA: Diagnosis not present

## 2018-09-17 ENCOUNTER — Inpatient Hospital Stay (HOSPITAL_COMMUNITY)
Admission: RE | Admit: 2018-09-17 | Discharge: 2018-09-18 | DRG: 470 | Disposition: A | Discharge status: Home / Self Care | Payer: 59 | Attending: Orthopedic Surgery | Admitting: Orthopedic Surgery

## 2018-09-17 ENCOUNTER — Inpatient Hospital Stay (HOSPITAL_COMMUNITY): Payer: 59

## 2018-09-17 ENCOUNTER — Encounter (HOSPITAL_COMMUNITY)
Admission: RE | Disposition: A | Discharge status: Home / Self Care | Payer: Self-pay | Source: Home / Self Care | Attending: Orthopedic Surgery

## 2018-09-17 ENCOUNTER — Inpatient Hospital Stay (HOSPITAL_COMMUNITY): Payer: 59 | Admitting: Certified Registered Nurse Anesthetist

## 2018-09-17 ENCOUNTER — Encounter (HOSPITAL_COMMUNITY): Payer: Self-pay | Admitting: Emergency Medicine

## 2018-09-17 ENCOUNTER — Other Ambulatory Visit: Payer: Self-pay

## 2018-09-17 ENCOUNTER — Inpatient Hospital Stay (HOSPITAL_COMMUNITY): Payer: 59 | Admitting: Physician Assistant

## 2018-09-17 DIAGNOSIS — E785 Hyperlipidemia, unspecified: Secondary | ICD-10-CM | POA: Diagnosis present

## 2018-09-17 DIAGNOSIS — Z8249 Family history of ischemic heart disease and other diseases of the circulatory system: Secondary | ICD-10-CM | POA: Diagnosis not present

## 2018-09-17 DIAGNOSIS — Z96642 Presence of left artificial hip joint: Secondary | ICD-10-CM | POA: Diagnosis not present

## 2018-09-17 DIAGNOSIS — Z96641 Presence of right artificial hip joint: Secondary | ICD-10-CM | POA: Diagnosis present

## 2018-09-17 DIAGNOSIS — M25752 Osteophyte, left hip: Secondary | ICD-10-CM | POA: Diagnosis present

## 2018-09-17 DIAGNOSIS — Z7951 Long term (current) use of inhaled steroids: Secondary | ICD-10-CM

## 2018-09-17 DIAGNOSIS — Z9181 History of falling: Secondary | ICD-10-CM

## 2018-09-17 DIAGNOSIS — Z8781 Personal history of (healed) traumatic fracture: Secondary | ICD-10-CM

## 2018-09-17 DIAGNOSIS — Y792 Prosthetic and other implants, materials and accessory orthopedic devices associated with adverse incidents: Secondary | ICD-10-CM | POA: Diagnosis not present

## 2018-09-17 DIAGNOSIS — Y838 Other surgical procedures as the cause of abnormal reaction of the patient, or of later complication, without mention of misadventure at the time of the procedure: Secondary | ICD-10-CM | POA: Diagnosis not present

## 2018-09-17 DIAGNOSIS — Z79899 Other long term (current) drug therapy: Secondary | ICD-10-CM | POA: Diagnosis not present

## 2018-09-17 DIAGNOSIS — Y92230 Patient room in hospital as the place of occurrence of the external cause: Secondary | ICD-10-CM | POA: Diagnosis not present

## 2018-09-17 DIAGNOSIS — Z8269 Family history of other diseases of the musculoskeletal system and connective tissue: Secondary | ICD-10-CM | POA: Diagnosis not present

## 2018-09-17 DIAGNOSIS — M1612 Unilateral primary osteoarthritis, left hip: Secondary | ICD-10-CM | POA: Diagnosis not present

## 2018-09-17 DIAGNOSIS — K219 Gastro-esophageal reflux disease without esophagitis: Secondary | ICD-10-CM | POA: Diagnosis present

## 2018-09-17 DIAGNOSIS — T84021A Dislocation of internal left hip prosthesis, initial encounter: Secondary | ICD-10-CM | POA: Diagnosis not present

## 2018-09-17 DIAGNOSIS — I10 Essential (primary) hypertension: Secondary | ICD-10-CM | POA: Diagnosis not present

## 2018-09-17 DIAGNOSIS — G4730 Sleep apnea, unspecified: Secondary | ICD-10-CM | POA: Diagnosis present

## 2018-09-17 DIAGNOSIS — Z833 Family history of diabetes mellitus: Secondary | ICD-10-CM | POA: Diagnosis not present

## 2018-09-17 DIAGNOSIS — Z9889 Other specified postprocedural states: Secondary | ICD-10-CM

## 2018-09-17 DIAGNOSIS — Z471 Aftercare following joint replacement surgery: Secondary | ICD-10-CM | POA: Diagnosis not present

## 2018-09-17 DIAGNOSIS — Z96649 Presence of unspecified artificial hip joint: Secondary | ICD-10-CM

## 2018-09-17 DIAGNOSIS — Z791 Long term (current) use of non-steroidal anti-inflammatories (NSAID): Secondary | ICD-10-CM | POA: Diagnosis not present

## 2018-09-17 DIAGNOSIS — Z419 Encounter for procedure for purposes other than remedying health state, unspecified: Secondary | ICD-10-CM

## 2018-09-17 HISTORY — PX: TOTAL HIP ARTHROPLASTY: SHX124

## 2018-09-17 LAB — TYPE AND SCREEN
ABO/RH(D): A POS
Antibody Screen: NEGATIVE

## 2018-09-17 SURGERY — ARTHROPLASTY, HIP, TOTAL, ANTERIOR APPROACH
Anesthesia: Monitor Anesthesia Care | Site: Hip | Laterality: Left

## 2018-09-17 MED ORDER — MORPHINE SULFATE (PF) 2 MG/ML IV SOLN: 0.5000 mg | INTRAVENOUS | Status: DC | PRN

## 2018-09-17 MED ORDER — MENTHOL 3 MG MT LOZG: 1.0000 | LOZENGE | OROMUCOSAL | Status: DC | PRN

## 2018-09-17 MED ORDER — PROPOFOL 500 MG/50ML IV EMUL
INTRAVENOUS | Status: DC | PRN
  Administered 2018-09-17: 50 ug/kg/min via INTRAVENOUS

## 2018-09-17 MED ORDER — METOCLOPRAMIDE HCL 5 MG PO TABS: 5.0000 mg | ORAL_TABLET | Freq: Three times a day (TID) | ORAL | Status: DC | PRN

## 2018-09-17 MED ORDER — GABAPENTIN 300 MG PO CAPS
900.0000 mg | ORAL_CAPSULE | Freq: Three times a day (TID) | ORAL | Status: DC
  Administered 2018-09-17 – 2018-09-18 (×3): 900 mg via ORAL
  Filled 2018-09-17 (×3): qty 3

## 2018-09-17 MED ORDER — FERROUS SULFATE 325 (65 FE) MG PO TABS
325.0000 mg | ORAL_TABLET | Freq: Three times a day (TID) | ORAL | Status: DC
  Administered 2018-09-18 (×2): 325 mg via ORAL
  Filled 2018-09-17 (×2): qty 1

## 2018-09-17 MED ORDER — FLUTICASONE PROPIONATE 50 MCG/ACT NA SUSP
2.0000 | Freq: Every day | NASAL | Status: DC
  Administered 2018-09-17 – 2018-09-18 (×2): 2 via NASAL
  Filled 2018-09-17: qty 16

## 2018-09-17 MED ORDER — DEXAMETHASONE SODIUM PHOSPHATE 10 MG/ML IJ SOLN
10.0000 mg | Freq: Once | INTRAMUSCULAR | Status: AC
  Administered 2018-09-17: 10 mg via INTRAVENOUS

## 2018-09-17 MED ORDER — TRANEXAMIC ACID-NACL 1000-0.7 MG/100ML-% IV SOLN
1000.0000 mg | INTRAVENOUS | Status: AC
  Administered 2018-09-17: 1000 mg via INTRAVENOUS
  Filled 2018-09-17: qty 100

## 2018-09-17 MED ORDER — DIPHENHYDRAMINE HCL 12.5 MG/5ML PO ELIX: 12.5000 mg | ORAL_SOLUTION | ORAL | Status: DC | PRN

## 2018-09-17 MED ORDER — ONDANSETRON HCL 4 MG/2ML IJ SOLN
INTRAMUSCULAR | Status: DC | PRN
  Administered 2018-09-17: 4 mg via INTRAVENOUS

## 2018-09-17 MED ORDER — ONDANSETRON HCL 4 MG/2ML IJ SOLN
INTRAMUSCULAR | Status: AC
  Filled 2018-09-17: qty 2

## 2018-09-17 MED ORDER — DOXAZOSIN MESYLATE 4 MG PO TABS
4.0000 mg | ORAL_TABLET | Freq: Every day | ORAL | Status: DC
  Administered 2018-09-18: 4 mg via ORAL
  Filled 2018-09-17: qty 1

## 2018-09-17 MED ORDER — ALUM & MAG HYDROXIDE-SIMETH 200-200-20 MG/5ML PO SUSP: 15.0000 mL | ORAL | Status: DC | PRN

## 2018-09-17 MED ORDER — METHOCARBAMOL 500 MG PO TABS
500.0000 mg | ORAL_TABLET | Freq: Four times a day (QID) | ORAL | Status: DC | PRN
  Administered 2018-09-17 – 2018-09-18 (×4): 500 mg via ORAL
  Filled 2018-09-17 (×4): qty 1

## 2018-09-17 MED ORDER — MIDAZOLAM HCL 2 MG/2ML IJ SOLN
INTRAMUSCULAR | Status: AC
  Filled 2018-09-17: qty 2

## 2018-09-17 MED ORDER — PROPOFOL 10 MG/ML IV BOLUS
INTRAVENOUS | Status: AC
  Filled 2018-09-17: qty 20

## 2018-09-17 MED ORDER — DIPHENHYDRAMINE HCL 25 MG PO TABS: 25.0000 mg | ORAL_TABLET | Freq: Every day | ORAL | Status: DC

## 2018-09-17 MED ORDER — METHOCARBAMOL 500 MG IVPB - SIMPLE MED
500.0000 mg | Freq: Four times a day (QID) | INTRAVENOUS | Status: DC | PRN
  Filled 2018-09-17: qty 50

## 2018-09-17 MED ORDER — MAGNESIUM CITRATE PO SOLN: 1.0000 | Freq: Once | ORAL | Status: DC | PRN

## 2018-09-17 MED ORDER — BUPIVACAINE HCL (PF) 0.75 % IJ SOLN
INTRAMUSCULAR | Status: DC | PRN
  Administered 2018-09-17: 2 mL via INTRATHECAL

## 2018-09-17 MED ORDER — FENTANYL CITRATE (PF) 100 MCG/2ML IJ SOLN
INTRAMUSCULAR | Status: DC | PRN
  Administered 2018-09-17 (×2): 50 ug via INTRAVENOUS

## 2018-09-17 MED ORDER — ONDANSETRON HCL 4 MG PO TABS: 4.0000 mg | ORAL_TABLET | Freq: Four times a day (QID) | ORAL | Status: DC | PRN

## 2018-09-17 MED ORDER — POLYETHYLENE GLYCOL 3350 17 G PO PACK
17.0000 g | PACK | Freq: Two times a day (BID) | ORAL | Status: DC
  Administered 2018-09-17 – 2018-09-18 (×2): 17 g via ORAL
  Filled 2018-09-17 (×2): qty 1

## 2018-09-17 MED ORDER — VALACYCLOVIR HCL 500 MG PO TABS
500.0000 mg | ORAL_TABLET | Freq: Every day | ORAL | Status: DC
  Administered 2018-09-17 – 2018-09-18 (×2): 500 mg via ORAL
  Filled 2018-09-17 (×2): qty 1

## 2018-09-17 MED ORDER — FENTANYL CITRATE (PF) 100 MCG/2ML IJ SOLN
INTRAMUSCULAR | Status: AC
  Filled 2018-09-17: qty 2

## 2018-09-17 MED ORDER — CABERGOLINE 0.5 MG PO TABS: 0.2500 mg | ORAL_TABLET | ORAL | Status: DC

## 2018-09-17 MED ORDER — DEXAMETHASONE SODIUM PHOSPHATE 10 MG/ML IJ SOLN
10.0000 mg | Freq: Once | INTRAMUSCULAR | Status: AC
  Administered 2018-09-18: 10 mg via INTRAVENOUS
  Filled 2018-09-17: qty 1

## 2018-09-17 MED ORDER — LACTATED RINGERS IV SOLN
INTRAVENOUS | Status: DC
  Administered 2018-09-17 (×3): via INTRAVENOUS

## 2018-09-17 MED ORDER — STERILE WATER FOR IRRIGATION IR SOLN
Status: DC | PRN
  Administered 2018-09-17: 1000 mL

## 2018-09-17 MED ORDER — TRANEXAMIC ACID-NACL 1000-0.7 MG/100ML-% IV SOLN
1000.0000 mg | Freq: Once | INTRAVENOUS | Status: AC
  Administered 2018-09-17: 1000 mg via INTRAVENOUS
  Filled 2018-09-17: qty 100

## 2018-09-17 MED ORDER — MIDAZOLAM HCL 5 MG/5ML IJ SOLN
INTRAMUSCULAR | Status: DC | PRN
  Administered 2018-09-17: 2 mg via INTRAVENOUS

## 2018-09-17 MED ORDER — PROPOFOL 10 MG/ML IV BOLUS
INTRAVENOUS | Status: AC
  Filled 2018-09-17: qty 60

## 2018-09-17 MED ORDER — DOCUSATE SODIUM 100 MG PO CAPS
100.0000 mg | ORAL_CAPSULE | Freq: Two times a day (BID) | ORAL | Status: DC
  Administered 2018-09-17 – 2018-09-18 (×2): 100 mg via ORAL
  Filled 2018-09-17 (×2): qty 1

## 2018-09-17 MED ORDER — METOCLOPRAMIDE HCL 5 MG/ML IJ SOLN: 5.0000 mg | Freq: Three times a day (TID) | INTRAMUSCULAR | Status: DC | PRN

## 2018-09-17 MED ORDER — PROPOFOL 10 MG/ML IV BOLUS
INTRAVENOUS | Status: AC
  Filled 2018-09-17: qty 40

## 2018-09-17 MED ORDER — ACETAMINOPHEN 325 MG PO TABS: 325.0000 mg | ORAL_TABLET | Freq: Four times a day (QID) | ORAL | Status: DC | PRN

## 2018-09-17 MED ORDER — SODIUM CHLORIDE 0.9 % IV SOLN
INTRAVENOUS | Status: DC
  Administered 2018-09-17 (×2): via INTRAVENOUS

## 2018-09-17 MED ORDER — ONDANSETRON HCL 4 MG/2ML IJ SOLN: 4.0000 mg | Freq: Four times a day (QID) | INTRAMUSCULAR | Status: DC | PRN

## 2018-09-17 MED ORDER — PHENOL 1.4 % MT LIQD
1.0000 | OROMUCOSAL | Status: DC | PRN
  Filled 2018-09-17: qty 177

## 2018-09-17 MED ORDER — BISACODYL 10 MG RE SUPP: 10.0000 mg | Freq: Every day | RECTAL | Status: DC | PRN

## 2018-09-17 MED ORDER — CHLORHEXIDINE GLUCONATE 4 % EX LIQD: 60.0000 mL | Freq: Once | CUTANEOUS | Status: DC

## 2018-09-17 MED ORDER — ATORVASTATIN CALCIUM 20 MG PO TABS
20.0000 mg | ORAL_TABLET | Freq: Every day | ORAL | Status: DC
  Administered 2018-09-18: 20 mg via ORAL
  Filled 2018-09-17: qty 1

## 2018-09-17 MED ORDER — HYDROCODONE-ACETAMINOPHEN 7.5-325 MG PO TABS
1.0000 | ORAL_TABLET | ORAL | Status: DC | PRN
  Administered 2018-09-17: 1 via ORAL
  Filled 2018-09-17: qty 1

## 2018-09-17 MED ORDER — TADALAFIL 5 MG PO TABS
5.0000 mg | ORAL_TABLET | Freq: Every day | ORAL | Status: DC
  Administered 2018-09-17 – 2018-09-18 (×2): 5 mg via ORAL
  Filled 2018-09-17 (×2): qty 1

## 2018-09-17 MED ORDER — SODIUM CHLORIDE 0.9 % IR SOLN
Status: DC | PRN
  Administered 2018-09-17: 1000 mL

## 2018-09-17 MED ORDER — EPHEDRINE SULFATE-NACL 50-0.9 MG/10ML-% IV SOSY
PREFILLED_SYRINGE | INTRAVENOUS | Status: DC | PRN
  Administered 2018-09-17: 10 mg via INTRAVENOUS
  Administered 2018-09-17: 5 mg via INTRAVENOUS
  Administered 2018-09-17: 10 mg via INTRAVENOUS
  Administered 2018-09-17: 5 mg via INTRAVENOUS

## 2018-09-17 MED ORDER — PHENYLEPHRINE 40 MCG/ML (10ML) SYRINGE FOR IV PUSH (FOR BLOOD PRESSURE SUPPORT)
PREFILLED_SYRINGE | INTRAVENOUS | Status: DC | PRN
  Administered 2018-09-17 (×2): 80 ug via INTRAVENOUS

## 2018-09-17 MED ORDER — DEXAMETHASONE SODIUM PHOSPHATE 10 MG/ML IJ SOLN
INTRAMUSCULAR | Status: AC
  Filled 2018-09-17: qty 1

## 2018-09-17 MED ORDER — PHENYLEPHRINE 40 MCG/ML (10ML) SYRINGE FOR IV PUSH (FOR BLOOD PRESSURE SUPPORT)
PREFILLED_SYRINGE | INTRAVENOUS | Status: AC
  Filled 2018-09-17: qty 10

## 2018-09-17 MED ORDER — CELECOXIB 200 MG PO CAPS
200.0000 mg | ORAL_CAPSULE | Freq: Two times a day (BID) | ORAL | Status: DC
  Administered 2018-09-17 – 2018-09-18 (×3): 200 mg via ORAL
  Filled 2018-09-17 (×3): qty 1

## 2018-09-17 MED ORDER — ASPIRIN 81 MG PO CHEW
81.0000 mg | CHEWABLE_TABLET | Freq: Two times a day (BID) | ORAL | Status: DC
  Administered 2018-09-17 – 2018-09-18 (×2): 81 mg via ORAL
  Filled 2018-09-17 (×2): qty 1

## 2018-09-17 MED ORDER — HYDROCODONE-ACETAMINOPHEN 5-325 MG PO TABS
1.0000 | ORAL_TABLET | ORAL | Status: DC | PRN
  Administered 2018-09-17 – 2018-09-18 (×4): 2 via ORAL
  Filled 2018-09-17 (×4): qty 2

## 2018-09-17 MED ORDER — EPHEDRINE 5 MG/ML INJ
INTRAVENOUS | Status: AC
  Filled 2018-09-17: qty 10

## 2018-09-17 MED ORDER — CEFAZOLIN SODIUM-DEXTROSE 2-4 GM/100ML-% IV SOLN
2.0000 g | INTRAVENOUS | Status: AC
  Administered 2018-09-17: 2 g via INTRAVENOUS
  Filled 2018-09-17: qty 100

## 2018-09-17 MED ORDER — CEFAZOLIN SODIUM-DEXTROSE 2-4 GM/100ML-% IV SOLN
2.0000 g | Freq: Four times a day (QID) | INTRAVENOUS | Status: AC
  Administered 2018-09-17 (×2): 2 g via INTRAVENOUS
  Filled 2018-09-17 (×2): qty 100

## 2018-09-17 MED ORDER — OMEPRAZOLE 20 MG PO CPDR
20.0000 mg | DELAYED_RELEASE_CAPSULE | Freq: Every day | ORAL | Status: DC
  Administered 2018-09-17: 20 mg via ORAL
  Filled 2018-09-17: qty 1

## 2018-09-17 SURGICAL SUPPLY — 43 items
BAG DECANTER FOR FLEXI CONT (MISCELLANEOUS) IMPLANT
BAG ZIPLOCK 12X15 (MISCELLANEOUS) IMPLANT
BLADE SAG 18X100X1.27 (BLADE) ×2 IMPLANT
BLADE SURG SZ10 CARB STEEL (BLADE) ×4 IMPLANT
COVER PERINEAL POST (MISCELLANEOUS) ×2 IMPLANT
COVER SURGICAL LIGHT HANDLE (MISCELLANEOUS) ×2 IMPLANT
COVER WAND RF STERILE (DRAPES) IMPLANT
CUP ACET PINNACLE SECTR 56MM (Hips) IMPLANT
DERMABOND ADVANCED (GAUZE/BANDAGES/DRESSINGS) ×1
DERMABOND ADVANCED .7 DNX12 (GAUZE/BANDAGES/DRESSINGS) ×1 IMPLANT
DRAPE STERI IOBAN 125X83 (DRAPES) ×2 IMPLANT
DRAPE U-SHAPE 47X51 STRL (DRAPES) ×4 IMPLANT
DRESSING AQUACEL AG SP 3.5X10 (GAUZE/BANDAGES/DRESSINGS) ×1 IMPLANT
DRSG AQUACEL AG ADV 3.5X10 (GAUZE/BANDAGES/DRESSINGS) ×1 IMPLANT
DRSG AQUACEL AG SP 3.5X10 (GAUZE/BANDAGES/DRESSINGS) ×2
DURAPREP 26ML APPLICATOR (WOUND CARE) ×2 IMPLANT
ELECT REM PT RETURN 15FT ADLT (MISCELLANEOUS) ×2 IMPLANT
ELIMINATOR HOLE APEX DEPUY (Hips) ×1 IMPLANT
GLOVE BIOGEL PI IND STRL 7.5 (GLOVE) ×1 IMPLANT
GLOVE BIOGEL PI IND STRL 8.5 (GLOVE) ×1 IMPLANT
GLOVE BIOGEL PI INDICATOR 7.5 (GLOVE) ×1
GLOVE BIOGEL PI INDICATOR 8.5 (GLOVE) ×1
GLOVE ECLIPSE 8.0 STRL XLNG CF (GLOVE) ×4 IMPLANT
GLOVE ORTHO TXT STRL SZ7.5 (GLOVE) ×2 IMPLANT
GOWN STRL REUS W/TWL 2XL LVL3 (GOWN DISPOSABLE) ×2 IMPLANT
GOWN STRL REUS W/TWL LRG LVL3 (GOWN DISPOSABLE) ×2 IMPLANT
HEAD CERAMIC DELTA 36 PLUS 1.5 (Hips) ×1 IMPLANT
HOLDER FOLEY CATH W/STRAP (MISCELLANEOUS) ×2 IMPLANT
PACK ANTERIOR HIP CUSTOM (KITS) ×2 IMPLANT
PINNACLE ALTRX PLUS 4 N 36X56 (Hips) ×1 IMPLANT
PINNACLE SECTOR CUP 56MM (Hips) ×2 IMPLANT
SCREW 6.5MMX25MM (Screw) ×1 IMPLANT
STEM TRI LOC BPS SZ6 W GRIPTON IMPLANT
SUT MNCRL AB 4-0 PS2 18 (SUTURE) ×2 IMPLANT
SUT STRATAFIX 0 PDS 27 VIOLET (SUTURE) ×2
SUT VIC AB 1 CT1 36 (SUTURE) ×6 IMPLANT
SUT VIC AB 2-0 CT1 27 (SUTURE) ×2
SUT VIC AB 2-0 CT1 TAPERPNT 27 (SUTURE) ×2 IMPLANT
SUTURE STRATFX 0 PDS 27 VIOLET (SUTURE) ×1 IMPLANT
TRAY FOLEY MTR SLVR 16FR STAT (SET/KITS/TRAYS/PACK) ×1 IMPLANT
TRI LOC BPS SZ 6 W GRIPTON ×2 IMPLANT
WATER STERILE IRR 1000ML POUR (IV SOLUTION) ×2 IMPLANT
YANKAUER SUCT BULB TIP 10FT TU (MISCELLANEOUS) ×1 IMPLANT

## 2018-09-17 NOTE — Anesthesia Preprocedure Evaluation (Signed)
Anesthesia Evaluation  Patient identified by MRN, date of birth, ID band Patient awake    Reviewed: Allergy & Precautions, NPO status , Patient's Chart, lab work & pertinent test results  History of Anesthesia Complications Negative for: history of anesthetic complications  Airway Mallampati: I  TM Distance: >3 FB Neck ROM: Full    Dental  (+) Dental Advisory Given   Pulmonary sleep apnea ,    breath sounds clear to auscultation       Cardiovascular hypertension, Pt. on medications (-) angina(-) Past MI and (-) CHF  Rhythm:Regular     Neuro/Psych negative neurological ROS  negative psych ROS   GI/Hepatic Neg liver ROS, GERD  Medicated and Controlled,  Endo/Other  negative endocrine ROS  Renal/GU negative Renal ROS     Musculoskeletal  (+) Arthritis ,   Abdominal   Peds  Hematology negative hematology ROS (+)   Anesthesia Other Findings   Reproductive/Obstetrics                             Anesthesia Physical Anesthesia Plan  ASA: II  Anesthesia Plan: MAC and Spinal   Post-op Pain Management:    Induction:   PONV Risk Score and Plan: 1 and Treatment may vary due to age or medical condition and Propofol infusion  Airway Management Planned: Nasal Cannula  Additional Equipment: None  Intra-op Plan:   Post-operative Plan:   Informed Consent: I have reviewed the patients History and Physical, chart, labs and discussed the procedure including the risks, benefits and alternatives for the proposed anesthesia with the patient or authorized representative who has indicated his/her understanding and acceptance.     Dental advisory given  Plan Discussed with: CRNA and Surgeon  Anesthesia Plan Comments:         Anesthesia Quick Evaluation

## 2018-09-17 NOTE — Anesthesia Procedure Notes (Signed)
Procedure Name: MAC Date/Time: 09/17/2018 8:30 AM Performed by: Claudia Desanctis, CRNA Pre-anesthesia Checklist: Patient identified, Emergency Drugs available, Suction available, Patient being monitored and Timeout performed Patient Re-evaluated:Patient Re-evaluated prior to induction Oxygen Delivery Method: Simple face mask

## 2018-09-17 NOTE — Transfer of Care (Signed)
Immediate Anesthesia Transfer of Care Note  Patient: Robert Carroll  Procedure(s) Performed: TOTAL HIP ARTHROPLASTY ANTERIOR APPROACH (Left Hip)  Patient Location: PACU  Anesthesia Type:Spinal  Level of Consciousness: awake and patient cooperative  Airway & Oxygen Therapy: Patient Spontanous Breathing and Patient connected to face mask  Post-op Assessment: Report given to RN and Post -op Vital signs reviewed and stable  Post vital signs: Reviewed and stable  Last Vitals:  Vitals Value Taken Time  BP 114/79 09/17/2018 10:35 AM  Temp    Pulse 69 09/17/2018 10:36 AM  Resp 13 09/17/2018 10:36 AM  SpO2 99 % 09/17/2018 10:36 AM  Vitals shown include unvalidated device data.  Last Pain:  Vitals:   09/17/18 0643  TempSrc:   PainSc: 0-No pain      Patients Stated Pain Goal: 4 (56/43/32 9518)  Complications: No apparent anesthesia complications

## 2018-09-17 NOTE — Progress Notes (Signed)
PT Cancellation Note  Patient Details Name: Robert Carroll MRN: [id number] DOB: 12/23/1955   Cancelled Treatment:    Reason Eval/Treat Not Completed: Other (comment);  Pt with post op dislocation, will defer PT eval at this time and await further orders/activity clarification from Dr. Janus Molder 09/17/2018, 1:39 PM

## 2018-09-17 NOTE — Anesthesia Procedure Notes (Addendum)
Spinal  Patient location during procedure: OR Start time: 09/17/2018 8:30 AM End time: 09/17/2018 8:35 AM Staffing Anesthesiologist: Oleta Mouse, MD Resident/CRNA: Claudia Desanctis, CRNA Performed: resident/CRNA  Preanesthetic Checklist Completed: patient identified, site marked, surgical consent, pre-op evaluation, timeout performed, IV checked, risks and benefits discussed and monitors and equipment checked Spinal Block Patient position: sitting Prep: DuraPrep Patient monitoring: heart rate, cardiac monitor, continuous pulse ox and blood pressure Approach: midline Location: L3-4 Injection technique: single-shot Needle Needle type: Sprotte  Needle gauge: 24 G Needle length: 10 cm Needle insertion depth: 9 cm Assessment Sensory level: T4

## 2018-09-17 NOTE — Interval H&P Note (Signed)
History and Physical Interval Note:  09/17/2018 7:09 AM  Robert Carroll  has presented today for surgery, with the diagnosis of left hip osteoarthritis  The various methods of treatment have been discussed with the patient and family. After consideration of risks, benefits and other options for treatment, the patient has consented to  Procedure(s) with comments: [place] (Left) - 70 minutes as a surgical intervention .  The patient's history has been reviewed, patient examined, no change in status, stable for surgery.  I have reviewed the patient's chart and labs.  Questions were answered to the patient's satisfaction.     Mauri Pole

## 2018-09-17 NOTE — Anesthesia Postprocedure Evaluation (Signed)
Anesthesia Post Note  Patient: Robert Carroll  Procedure(s) Performed: TOTAL HIP ARTHROPLASTY ANTERIOR APPROACH (Left Hip)     Patient location during evaluation: PACU Anesthesia Type: MAC and Spinal Level of consciousness: awake and alert Pain management: pain level controlled Vital Signs Assessment: post-procedure vital signs reviewed and stable Respiratory status: spontaneous breathing, nonlabored ventilation, respiratory function stable and patient connected to nasal cannula oxygen Cardiovascular status: stable and blood pressure returned to baseline Postop Assessment: no apparent nausea or vomiting Anesthetic complications: no    Last Vitals:  Vitals:   09/17/18 2051 09/17/18 2125  BP:  131/85  Pulse: 91 78  Resp: 15 16  Temp:  36.7 C  SpO2: 97% 98%    Last Pain:  Vitals:   09/17/18 2125  TempSrc: Oral  PainSc:                  Schwanda Zima

## 2018-09-17 NOTE — Op Note (Signed)
NAME:  Robert Carroll NO.: [id number]      MEDICAL RECORD NO.: [id number]      FACILITY:  [facility]      PHYSICIAN:  Mauri Pole  DATE OF BIRTH:  16-Sep-1955     DATE OF PROCEDURE:  09/17/2018                                 OPERATIVE REPORT         PREOPERATIVE DIAGNOSIS: Left  hip osteoarthritis.      POSTOPERATIVE DIAGNOSIS:  Left hip osteoarthritis.      PROCEDURE:  Left total hip replacement through an anterior approach   utilizing DePuy THR system, component size 56 mm pinnacle cup, a size 36+4 neutral   Altrex liner, a size 6 Hi Tri Lock stem with a 36+1.5 delta ceramic   ball.      SURGEON:  Pietro Cassis. Alvan Dame, M.D.      ASSISTANT:  Nehemiah Massed, PA-C     ANESTHESIA:  Spinal.      SPECIMENS:  None.      COMPLICATIONS:  None.      BLOOD LOSS:  200 cc     DRAINS:  None.      INDICATION OF THE PROCEDURE:  Robert Carroll is a 63 y.o. male who had   presented to office for evaluation of left hip pain.  Radiographs revealed   progressive degenerative changes with bone-on-bone   articulation of the  hip joint, including subchondral cystic changes and osteophytes.  The patient had painful limited range of   motion significantly affecting their overall quality of life and function.  The patient was failing to    respond to conservative measures including medications and/or injections and activity modification and at this point was ready   to proceed with more definitive measures.  Consent was obtained for   benefit of pain relief.  Specific risks of infection, DVT, component   failure, dislocation, neurovascular injury, and need for revision surgery were reviewed in the office as well discussion of   the anterior versus posterior approach were reviewed.     PROCEDURE IN DETAIL:  The patient was brought to operative theater.   Once adequate anesthesia, preoperative antibiotics, 2 gm of Ancef, 1 gm of Tranexamic Acid, and 10 mg of  Decadron were administered, the patient was positioned supine on the [employer] table.  Once the patient was safely positioned with adequate padding of boney prominences we predraped out the hip, and used fluoroscopy to confirm orientation of the pelvis.      The left hip was then prepped and draped from proximal iliac crest to   mid thigh with a shower curtain technique.      Time-out was performed identifying the patient, planned procedure, and the appropriate extremity.     An incision was then made 2 cm lateral to the   anterior superior iliac spine extending over the orientation of the   tensor fascia lata muscle and sharp dissection was carried down to the   fascia of the muscle.      The fascia was then incised.  The muscle belly was identified and swept   laterally and retractor placed along the superior neck.  Following   cauterization of the circumflex vessels and removing some pericapsular  fat, a second cobra retractor was placed on the inferior neck.  A T-capsulotomy was made along the line of the   superior neck to the trochanteric fossa, then extended proximally and   distally.  Tag sutures were placed and the retractors were then placed   intracapsular.  We then identified the trochanteric fossa and   orientation of my neck cut and then made a neck osteotomy with the femur on traction.  The femoral   head was removed without difficulty or complication.  Traction was let   off and retractors were placed posterior and anterior around the   acetabulum.      The labrum and foveal tissue were debrided.  I began reaming with a 48 mm   reamer and reamed up to 55 mm reamer with good bony bed preparation and a 56 mm  cup was chosen.  The final 56 mm Pinnacle cup was then impacted under fluoroscopy to confirm the depth of penetration and orientation with respect to   Abduction and forward flexion.  A screw was placed into the ilium followed by the hole eliminator.  The final   36+4  neutral Altrex liner was impacted with good visualized rim fit.  The cup was positioned anatomically within the acetabular portion of the pelvis.      At this point, the femur was rolled to 100 degrees.  Further capsule was   released off the inferior aspect of the femoral neck.  I then   released the superior capsule proximally.  With the leg in a neutral position the hook was placed laterally   along the femur under the vastus lateralis origin and elevated manually and then held in position using the hook attachment on the bed.  The leg was then extended and adducted with the leg rolled to 100   degrees of external rotation.  Retractors were placed along the medial calcar and posteriorly over the greater trochanter.  Once the proximal femur was fully   exposed, I used a box osteotome to set orientation.  I then began   broaching with the starting chili pepper broach and passed this by hand and then broached up to 6.  With the 6 broach in place I chose a high offset neck and did several trial reductions.  The offset was appropriate, leg lengths   appeared to be equal best matched with the +1.5 head ball trial confirmed radiographically.   Given these findings, I went ahead and dislocated the hip, repositioned all   retractors and positioned the right hip in the extended and abducted position.  The final 6 Hi Tri Lock stem was   chosen and it was impacted down to the level of neck cut.  Based on this   and the trial reductions, a final 36+1.5 delta ceramic ball was chosen and   impacted onto a clean and dry trunnion, and the hip was reduced.  The   hip had been irrigated throughout the case again at this point.  I did   reapproximate the superior capsular leaflet to the anterior leaflet   using #1 Vicryl.  The fascia of the   tensor fascia lata muscle was then reapproximated using #1 Vicryl and #0 Stratafix sutures.  The   remaining wound was closed with 2-0 Vicryl and running 4-0 Monocryl.    The hip was cleaned, dried, and dressed sterilely using Dermabond and   Aquacel dressing.  The patient was then brought   to recovery room  in stable condition tolerating the procedure well.    Nehemiah Massed, PA-C was present for the entirety of the case involved from   preoperative positioning, perioperative retractor management, general   facilitation of the case, as well as primary wound closure as assistant.            Pietro Cassis Alvan Dame, M.D.        09/17/2018 10:01 AM

## 2018-09-18 LAB — BASIC METABOLIC PANEL
Anion gap: 5 (ref 5–15)
BUN: 11 mg/dL (ref 8–23)
CO2: 24 mmol/L (ref 22–32)
Calcium: 8.2 mg/dL — ABNORMAL LOW (ref 8.9–10.3)
Chloride: 110 mmol/L (ref 98–111)
Creatinine, Ser: 0.94 mg/dL (ref 0.61–1.24)
GFR calc Af Amer: >60 mL/min (ref >60)
GFR calc non Af Amer: >60 mL/min (ref >60)
GLUCOSE: 134 mg/dL — AB (ref 70–99)
Potassium: 3.6 mmol/L (ref 3.5–5.1)
Sodium: 139 mmol/L (ref 135–145)

## 2018-09-18 LAB — CBC
HCT: 32.6 % — ABNORMAL LOW (ref 39.0–52.0)
Hemoglobin: 10.7 g/dL — ABNORMAL LOW (ref 13.0–17.0)
MCH: 31.1 pg (ref 26.0–34.0)
MCHC: 32.8 g/dL (ref 30.0–36.0)
MCV: 94.8 fL (ref 80.0–100.0)
Platelets: 131 10*3/uL — ABNORMAL LOW (ref 150–400)
RBC: 3.44 MIL/uL — ABNORMAL LOW (ref 4.22–5.81)
RDW: 12.7 % (ref 11.5–15.5)
WBC: 5.4 10*3/uL (ref 4.0–10.5)
nRBC: 0 % (ref 0.0–0.2)

## 2018-09-18 MED ORDER — ASPIRIN 81 MG PO CHEW: 81.0000 mg | CHEWABLE_TABLET | Freq: Two times a day (BID) | ORAL | 0 refills | Status: AC

## 2018-09-18 MED ORDER — POTASSIUM CHLORIDE CRYS ER 20 MEQ PO TBCR
20.0000 meq | EXTENDED_RELEASE_TABLET | Freq: Once | ORAL | Status: AC
  Administered 2018-09-18: 20 meq via ORAL
  Filled 2018-09-18: qty 1

## 2018-09-18 MED ORDER — METHOCARBAMOL 500 MG PO TABS: 500.0000 mg | ORAL_TABLET | Freq: Four times a day (QID) | ORAL | 0 refills | Status: DC | PRN

## 2018-09-18 MED ORDER — FERROUS SULFATE 325 (65 FE) MG PO TABS: 325.0000 mg | ORAL_TABLET | Freq: Three times a day (TID) | ORAL | 0 refills | Status: DC

## 2018-09-18 MED ORDER — HYDROCODONE-ACETAMINOPHEN 7.5-325 MG PO TABS: 1.0000 | ORAL_TABLET | ORAL | 0 refills | Status: DC | PRN

## 2018-09-18 NOTE — Evaluation (Signed)
Physical Therapy Evaluation Patient Details Name: Robert Carroll MRN: [id number] DOB: 04/24/1956 Today's Date: 09/18/2018   History of Present Illness  Pt s/p L THR and with hx of R THR`  Clinical Impression  Pt s/p L THR and presents with decreased L LE strength/ROM and post op pain limiting functional mobility.  Pt should progress to dc home with family assist.    Follow Up Recommendations Follow surgeon's recommendation for DC plan and follow-up therapies    Equipment Recommendations  None recommended by PT    Recommendations for Other Services       Precautions / Restrictions Precautions Precautions: Fall Restrictions Weight Bearing Restrictions: No      Mobility  Bed Mobility Overal bed mobility: Needs Assistance Bed Mobility: Supine to Sit     Supine to sit: Min assist;Min guard     General bed mobility comments: assist to manage L LE only; cues for sequence  Transfers Overall transfer level: Needs assistance Equipment used: Rolling walker (2 wheeled) Transfers: Sit to/from Stand Sit to Stand: Min guard         General transfer comment: cues for LE management and use of UEs to self assist  Ambulation/Gait Ambulation/Gait assistance: Min guard Gait Distance (Feet): 200 Feet Assistive device: Rolling walker (2 wheeled) Gait Pattern/deviations: Step-to pattern;Step-through pattern;Decreased step length - right;Decreased step length - left;Shuffle;Trunk flexed Gait velocity: decr   General Gait Details: cues for posture, position from RW and initial sequence  Stairs            Wheelchair Mobility    Modified Rankin (Stroke Patients Only)       Balance Overall balance assessment: Mild deficits observed, not formally tested                                           Pertinent Vitals/Pain Pain Assessment: 0-10 Pain Score: 6  Pain Location: L hip Pain Descriptors / Indicators: Aching;Sore Pain Intervention(s): Limited  activity within patient's tolerance;Monitored during session;Premedicated before session;Ice applied    Home Living Family/patient expects to be discharged to:: Private residence Living Arrangements: Spouse/significant other Available Help at Discharge: Family Type of Home: House Home Access: Stairs to enter Entrance Stairs-Rails: Right Entrance Stairs-Number of Steps: 3 Home Layout: Able to live on main level with bedroom/bathroom;Two level Home Equipment: Walker - 2 wheels;Cane - single point;Bedside commode      Prior Function Level of Independence: Independent               Hand Dominance        Extremity/Trunk Assessment   Upper Extremity Assessment Upper Extremity Assessment: Overall WFL for tasks assessed    Lower Extremity Assessment Lower Extremity Assessment: LLE deficits/detail LLE Deficits / Details: 2+/5 strength at hip with AAROM at hip to 80 flex and 15 abd    Cervical / Trunk Assessment Cervical / Trunk Assessment: Normal  Communication   Communication: No difficulties  Cognition Arousal/Alertness: Awake/alert Behavior During Therapy: WFL for tasks assessed/performed Overall Cognitive Status: Within Functional Limits for tasks assessed                                        General Comments      Exercises Total Joint Exercises Ankle Circles/Pumps: AROM;Both;15 reps;Supine Quad Sets: AROM;Both;10 reps;Supine Heel Slides:  AAROM;Left;20 reps;Supine Hip ABduction/ADduction: AAROM;Left;15 reps;Supine Long Arc Quad: AAROM;AROM;Left;10 reps;Supine   Assessment/Plan    PT Assessment Patient needs continued PT services  PT Problem List Decreased strength;Decreased range of motion;Decreased activity tolerance;Decreased balance;Decreased mobility;Decreased knowledge of use of DME;Pain       PT Treatment Interventions DME instruction;Gait training;Stair training;Functional mobility training;Therapeutic activities;Therapeutic  exercise;Patient/family education    PT Goals (Current goals can be found in the Care Plan section)  Acute Rehab PT Goals Patient Stated Goal: Regain IND PT Goal Formulation: With patient Time For Goal Achievement: 09/19/18 Potential to Achieve Goals: Good    Frequency 7X/week   Barriers to discharge        Co-evaluation               AM-PAC PT "6 Clicks" Mobility  Outcome Measure Help needed turning from your back to your side while in a flat bed without using bedrails?: A Little Help needed moving from lying on your back to sitting on the side of a flat bed without using bedrails?: A Little Help needed moving to and from a bed to a chair (including a wheelchair)?: A Little Help needed standing up from a chair using your arms (e.g., wheelchair or bedside chair)?: A Little Help needed to walk in hospital room?: A Little Help needed climbing 3-5 steps with a railing? : A Little 6 Click Score: 18    End of Session Equipment Utilized During Treatment: Gait belt Activity Tolerance: Patient tolerated treatment well Patient left: in chair;with call bell/phone within reach;with family/visitor present Nurse Communication: Mobility status PT Visit Diagnosis: Difficulty in walking, not elsewhere classified (R26.2)    Time: 8366-2947 PT Time Calculation (min) (ACUTE ONLY): 35 min   Charges:   PT Evaluation $PT Eval Low Complexity: 1 Low PT Treatments $Therapeutic Exercise: 8-22 mins        Debe Coder PT Acute Rehabilitation Services Pager [phone] Office [phone]   Robert Carroll 09/18/2018, 12:35 PM

## 2018-09-18 NOTE — Progress Notes (Signed)
Patient ID: Robert Carroll, male   DOB: 01/15/1956, 63 y.o.   MRN: [id number] Subjective: 1 Day Post-Op Procedure(s) (LRB): TOTAL HIP ARTHROPLASTY ANTERIOR APPROACH (Left)    Patient reports pain as mild.  Better tolerated initial post op period than what he recalls from his right hip  Reviewed events from the PACU with his hip and issues with spinal and external rotation of hip resulting in finding of dislocation and the subsequent reduction and follow up X-ray  Objective:   VITALS:   Vitals:   09/18/18 0039 09/18/18 0451  BP: 125/82 133/84  Pulse: 81 63  Resp: 16 14  Temp: 98.1 F (36.7 C) 98 F (36.7 C)  SpO2: 98% 99%    Neurovascular intact Incision: dressing C/D/I  Good active hip flexion in bed and quad function  LABS Recent Labs    09/18/18 0536  HGB 10.7*  HCT 32.6*  WBC 5.4  PLT 131*    Recent Labs    09/18/18 0536  NA 139  K 3.6  BUN 11  CREATININE 0.94  GLUCOSE 134*    No results for input(s): LABPT, INR in the last 72 hours.   Assessment/Plan: 1 Day Post-Op Procedure(s) (LRB): TOTAL HIP ARTHROPLASTY ANTERIOR APPROACH (Left)   Advance diet Up with therapy   Would like to go home if progresses with therapy well RTC in 2 weeks Reviewed goals

## 2018-09-18 NOTE — Discharge Summary (Signed)
Physician Discharge Summary   Patient ID: Robert Carroll MRN: [id number] DOB/AGE: 10/23/1955 63 y.o.  Admit date: 09/17/2018 Discharge date: 09/18/2018  Primary Diagnosis: Left hip osteoarthritis   Admission Diagnoses:  Past Medical History:  Diagnosis Date  . Arthritis   . Colon polyp    adenomatous, 2008  . Genital herpes   . Hyperlipidemia   . Hypertension   . Sleep apnea    on CPAP   Discharge Diagnoses:   Active Problems:   S/P left THA, AA   Status post total hip replacement, left  Estimated body mass index is 29.59 kg/m as calculated from the following:   Height as of this encounter: 6' (1.829 m).   Weight as of this encounter: 99 kg.  Procedure:  Procedure(s) (LRB): TOTAL HIP ARTHROPLASTY ANTERIOR APPROACH (Left)   Consults: None  HPI: Robert Carroll, 63 y.o. male, has a history of pain and functional disability in the left hip(s) due to arthritis and patient has failed non-surgical conservative treatments for greater than 12 weeks to include NSAID's and/or analgesics, corticosteriod injections and activity modification.  Onset of symptoms was gradual starting 2+ years ago with gradually worsening course since that time.The patient noted prior procedures of the hip to include arthroplasty on the right hip in June 2018 per Dr. Alvan Dame.  Patient currently rates pain in the left hip at 8 out of 10 with activity. Patient has night pain, worsening of pain with activity and weight bearing, trendelenberg gait, pain that interfers with activities of daily living and pain with passive range of motion. Patient has evidence of periarticular osteophytes and joint space narrowing by imaging studies. This condition presents safety issues increasing the risk of falls. There is no current active infection.  Risks, benefits and expectations were discussed with the patient.  Risks including but not limited to the risk of anesthesia, blood clots, nerve damage, blood vessel damage, failure of the  prosthesis, infection and up to and including death.  Patient understand the risks, benefits and expectations and wishes to proceed with surgery.   Laboratory Data: Admission on 09/17/2018, Discharged on 09/18/2018  Component Date Value Ref Range Status  . WBC 09/18/2018 5.4  4.0 - 10.5 K/uL Final  . RBC 09/18/2018 3.44* 4.22 - 5.81 MIL/uL Final  . Hemoglobin 09/18/2018 10.7* 13.0 - 17.0 g/dL Final  . HCT 09/18/2018 32.6* 39.0 - 52.0 % Final  . MCV 09/18/2018 94.8  80.0 - 100.0 fL Final  . MCH 09/18/2018 31.1  26.0 - 34.0 pg Final  . MCHC 09/18/2018 32.8  30.0 - 36.0 g/dL Final  . RDW 09/18/2018 12.7  11.5 - 15.5 % Final  . Platelets 09/18/2018 131* 150 - 400 K/uL Final  . nRBC 09/18/2018 0.0  0.0 - 0.2 % Final   Performed at [facility], [place] [address] [place], [place] [address]  . Sodium 09/18/2018 139  135 - 145 mmol/L Final  . Potassium 09/18/2018 3.6  3.5 - 5.1 mmol/L Final  . Chloride 09/18/2018 110  98 - 111 mmol/L Final  . CO2 09/18/2018 24  22 - 32 mmol/L Final  . Glucose, Bld 09/18/2018 134* 70 - 99 mg/dL Final  . BUN 09/18/2018 11  8 - 23 mg/dL Final  . Creatinine, Ser 09/18/2018 0.94  0.61 - 1.24 mg/dL Final  . Calcium 09/18/2018 8.2* 8.9 - 10.3 mg/dL Final  . GFR calc non Af Amer 09/18/2018 >60  >60 mL/min Final  . GFR calc Af Amer 09/18/2018 >60  >  60 mL/min Final  . Anion gap 09/18/2018 5  5 - 15 Final   Performed at [facility], [place] [address], [place], [place] [address]  Hospital Outpatient Visit on 09/09/2018  Component Date Value Ref Range Status  . Sodium 09/09/2018 139  135 - 145 mmol/L Final  . Potassium 09/09/2018 3.9  3.5 - 5.1 mmol/L Final  . Chloride 09/09/2018 108  98 - 111 mmol/L Final  . CO2 09/09/2018 21* 22 - 32 mmol/L Final  . Glucose, Bld 09/09/2018 103* 70 - 99 mg/dL Final  . BUN 09/09/2018 16  8 - 23 mg/dL Final  . Creatinine, Ser 09/09/2018 0.93  0.61 - 1.24 mg/dL Final  . Calcium 09/09/2018 9.4  8.9 -  10.3 mg/dL Final  . GFR calc non Af Amer 09/09/2018 >60  >60 mL/min Final  . GFR calc Af Amer 09/09/2018 >60  >60 mL/min Final  . Anion gap 09/09/2018 10  5 - 15 Final   Performed at [facility], [place] [address], [place], [place] [address]  . WBC 09/09/2018 2.4* 4.0 - 10.5 K/uL Final  . RBC 09/09/2018 4.57  4.22 - 5.81 MIL/uL Final  . Hemoglobin 09/09/2018 13.9  13.0 - 17.0 g/dL Final  . HCT 09/09/2018 42.1  39.0 - 52.0 % Final  . MCV 09/09/2018 92.1  80.0 - 100.0 fL Final  . MCH 09/09/2018 30.4  26.0 - 34.0 pg Final  . MCHC 09/09/2018 33.0  30.0 - 36.0 g/dL Final  . RDW 09/09/2018 12.9  11.5 - 15.5 % Final  . Platelets 09/09/2018 168  150 - 400 K/uL Final  . nRBC 09/09/2018 0.0  0.0 - 0.2 % Final   Performed at [facility], [place] [address], [place], [place] [address]  . MRSA, PCR 09/09/2018 NEGATIVE  NEGATIVE Final  . Staphylococcus aureus 09/09/2018 NEGATIVE  NEGATIVE Final   Comment: (NOTE) The Xpert SA Assay (FDA approved for NASAL specimens in patients 63 years of age and older), is one component of a comprehensive surveillance program. It is not intended to diagnose infection nor to guide or monitor treatment. Performed at [facility], [place] [address] [place], [place] [address]   . ABO/RH(D) 09/09/2018 A POS   Final  . Antibody Screen 09/09/2018 NEG   Final  . Sample Expiration 09/09/2018 09/20/2018   Final  . Extend sample reason 09/09/2018    Final                   Value:NO TRANSFUSIONS OR PREGNANCY IN THE PAST 3 MONTHS Performed at [facility], [place] [address] [place],  [address]      X-Rays:X-ray Pelvis Complete  Result Date: 09/17/2018 CLINICAL DATA:  Status post closed manip of just placed left total ant hip that was shown to be dislocated in his first post op image in PACU. EXAM: PELVIS - 1-2 VIEW COMPARISON:  Plain film from earlier same day. FINDINGS: The femoral head component of  patient's LEFT hip arthroplasty hardware now appears to be appropriately positioned relative to the acetabular cup. Expected postsurgical changes within the overlying soft tissues. IMPRESSION: LEFT hip arthroplasty hardware now appears to be appropriately aligned. Electronically Signed   By: Franki Cabot M.D.   On: 09/17/2018 12:43   Dg Pelvis Portable  Result Date: 09/17/2018 CLINICAL DATA:  63 year old male post left hip replacement. Subsequent encounter. EXAM: PORTABLE PELVIS 1-2 VIEWS COMPARISON:  Intraoperative exam same date. FINDINGS: Post recent  left hip replacement. The femoral head component is not located within the acetabular component. The left acetabular screw appears to enter the medial cortex of the left pelvis. Remote right hip replacement. IMPRESSION: Post recent left hip replacement. The femoral head component is not located within the acetabular component. The left acetabular screw appears to enter the medial cortex of the left pelvis. These results will be called to the ordering clinician or representative by the Radiologist Assistant, and communication documented in the PACS or zVision Dashboard. Electronically Signed   By: Genia Del M.D.   On: 09/17/2018 11:37   Dg C-arm 1-60 Min-no Report  Result Date: 09/17/2018 Fluoroscopy was utilized by the requesting physician.  No radiographic interpretation.   Dg Hip Operative Unilat W Or W/o Pelvis Left  Result Date: 09/17/2018 CLINICAL DATA:  63 year old male for left hip replacement. Initial encounter. EXAM: OPERATIVE left HIP (WITH PELVIS IF PERFORMED) 10 VIEWS TECHNIQUE: Fluoroscopic spot image(s) were submitted for interpretation post-operatively. Fluoroscopic time: 28 seconds. COMPARISON:  None. FINDINGS: Post left hip prosthesis. Last image does not include femoral head component. Left acetabular screw left pelvic cortex level. Remote right hip replacement. IMPRESSION: 1. Recent left hip replacement as noted above. This can be  assessed on follow-up. 2. Remote right hip replacement. Electronically Signed   By: Genia Del M.D.   On: 09/17/2018 10:44    EKG: Orders placed or performed during the hospital encounter of 09/09/18  . EKG 12 lead  . EKG 12 lead     Hospital Course: Robert Carroll is a 63 y.o. who was admitted to [facility]. They were brought to the operating room on 09/17/2018 and underwent Procedure(s): [place].  Patient tolerated the procedure well and was later transferred to the recovery room. Patient was found to have post-operative left hip dislocation in PACU on xray, which was subsequently reduced by Dr. Alvan Dame with post-reduction xrays. Patient transferred to orthopaedic floor for postoperative care. They were given PO and IV analgesics for pain control following their surgery. They were given 24 hours of postoperative antibiotics of  Anti-infectives (From admission, onward)   Start     Dose/Rate Route Frequency Ordered Stop   09/17/18 1400  valACYclovir (VALTREX) tablet 500 mg     500 mg Oral Daily 09/17/18 1303     09/17/18 1400  ceFAZolin (ANCEF) IVPB 2g/100 mL premix     2 g 200 mL/hr over 30 Minutes Intravenous Every 6 hours 09/17/18 1303 09/17/18 2037   09/17/18 0630  ceFAZolin (ANCEF) IVPB 2g/100 mL premix     2 g 200 mL/hr over 30 Minutes Intravenous On call to O.R. 09/17/18 [id number] 09/17/18 0847     and started on DVT prophylaxis in the form of Aspirin.   PT and OT were ordered for total joint protocol. Discharge planning consulted to help with postop disposition and equipment needs.  Patient had a good night on the evening of surgery. They started to get up OOB with therapy on POD #1. Potassium of 3.6, 20 mEq of potassium chloride given. Pt was seen during rounds and was ready to go home pending progress with therapy. He worked with therapy on POD #1 and was meeting his goals. Pt was discharged to home later that day in stable condition.  Diet: Regular  diet Activity: WBAT Follow-up: in 2 weeks Disposition: Home Discharged Condition: good   Discharge Instructions    Call MD / Call 911   Complete by:  As  directed    If you experience chest pain or shortness of breath, CALL 911 and be transported to the hospital emergency room.  If you develope a fever above 101 F, pus (white drainage) or increased drainage or redness at the wound, or calf pain, call your surgeon's office.   Change dressing   Complete by:  As directed    Maintain surgical dressing until follow up in the clinic. If the edges start to pull up, may reinforce with tape. If the dressing is no longer working, may remove and cover with gauze and tape, but must keep the area dry and clean.  Call with any questions or concerns.   Change dressing   Complete by:  As directed    Maintain surgical dressing until follow up in the clinic. If the edges start to pull up, may reinforce with tape. If the dressing is no longer working, may remove and cover with gauze and tape, but must keep the area dry and clean.  Call with any questions or concerns.   Constipation Prevention   Complete by:  As directed    Drink plenty of fluids.  Prune juice may be helpful.  You may use a stool softener, such as Colace (over the counter) 100 mg twice a day.  Use MiraLax (over the counter) for constipation as needed.   Diet - low sodium heart healthy   Complete by:  As directed    Discharge instructions   Complete by:  As directed    Maintain surgical dressing until follow up in the clinic. If the edges start to pull up, may reinforce with tape. If the dressing is no longer working, may remove and cover with gauze and tape, but must keep the area dry and clean.  Follow up in 2 weeks at [facility]. Call with any questions or concerns.   Increase activity slowly as tolerated   Complete by:  As directed    Weight bearing as tolerated with assist device (walker, cane, etc) as directed, use it as long  as suggested by your surgeon or therapist, typically at least 4-6 weeks.   TED hose   Complete by:  As directed    Use stockings (TED hose) for 2 weeks on both leg(s).  You may remove them at night for sleeping.   TED hose   Complete by:  As directed    Use stockings (TED hose) for 2 weeks on both leg(s).  You may remove them at night for sleeping.     Allergies as of 09/18/2018   Not on File     Medication List    STOP taking these medications   acetaminophen 325 MG tablet Commonly known as:  TYLENOL   acetaminophen 500 MG tablet Commonly known as:  TYLENOL   aspirin EC 81 MG tablet Replaced by:  aspirin 81 MG chewable tablet   meloxicam 15 MG tablet Commonly known as:  MOBIC     TAKE these medications   amLODipine-benazepril 10-20 MG capsule Commonly known as:  LOTREL Take 1 capsule by mouth at bedtime.   aspirin 81 MG chewable tablet Chew 1 tablet (81 mg total) by mouth 2 (two) times daily for 28 days. Replaces:  aspirin EC 81 MG tablet   atorvastatin 20 MG tablet Commonly known as:  LIPITOR Take 20 mg by mouth daily.   cabergoline 0.5 MG tablet Commonly known as:  DOSTINEX Take 0.25 mg by mouth 2 (two) times a week. Monday and Friday morning  diphenhydrAMINE 25 MG tablet Commonly known as:  BENADRYL Take 25 mg by mouth daily.   doxazosin 4 MG tablet Commonly known as:  CARDURA Take 4 mg by mouth daily.   ferrous sulfate 325 (65 FE) MG tablet Take 1 tablet (325 mg total) by mouth 3 (three) times daily after meals for 28 days.   Fish Oil 1000 MG Caps Take 1,000 mg by mouth daily.   fluticasone 50 MCG/ACT nasal spray Commonly known as:  FLONASE Place 2 sprays into both nostrils daily.   gabapentin 300 MG capsule Commonly known as:  NEURONTIN TAKE 3 CAPSULES BY MOUTH 3  TIMES DAILY What changed:  See the new instructions.   HYDROcodone-acetaminophen 7.5-325 MG tablet Commonly known as:  NORCO Take 1-2 tablets by mouth every 4 (four) hours as  needed for severe pain (pain score 7-10).   methocarbamol 500 MG tablet Commonly known as:  ROBAXIN Take 1 tablet (500 mg total) by mouth every 6 (six) hours as needed for muscle spasms.   multivitamin with minerals Tabs tablet Take 1 tablet by mouth daily.   omeprazole 20 MG capsule Commonly known as:  PRILOSEC Take 20 mg by mouth daily before supper.   tadalafil 5 MG tablet Commonly known as:  CIALIS Take 5 mg by mouth daily.   valACYclovir 1000 MG tablet Commonly known as:  VALTREX Take 500 mg by mouth daily.            Durable Medical Equipment  (From admission, onward)         Start     Ordered   09/17/18 1303  DME Walker rolling  Once    Question:  Patient needs a walker to treat with the following condition  Answer:  S/P total hip arthroplasty   09/17/18 1303   09/17/18 1303  DME 3 n 1  Once     09/17/18 1303           Discharge Care Instructions  (From admission, onward)         Start     Ordered   09/18/18 0000  Change dressing    Comments:  Maintain surgical dressing until follow up in the clinic. If the edges start to pull up, may reinforce with tape. If the dressing is no longer working, may remove and cover with gauze and tape, but must keep the area dry and clean.  Call with any questions or concerns.   09/18/18 0825   09/18/18 0000  Change dressing    Comments:  Maintain surgical dressing until follow up in the clinic. If the edges start to pull up, may reinforce with tape. If the dressing is no longer working, may remove and cover with gauze and tape, but must keep the area dry and clean.  Call with any questions or concerns.   09/18/18 0825         Follow-up Information    Paralee Cancel, MD. Schedule an appointment as soon as possible for a visit in 2 week(s).   Specialty:  Orthopedic Surgery Contact information: [address] [place] [address] [phone]           Signed: Griffith Citron, PA-C Orthopedic  Surgery 09/18/2018, 3:22 PM

## 2018-09-18 NOTE — Discharge Instructions (Signed)

## 2018-09-18 NOTE — Care Management Note (Signed)
Case Management Note  Patient Details  Name: Robert Carroll MRN: [id number] Date of Birth: Jun 13, 1956  Subjective/Objective:                  Discharge planning  Action/Plan: DME-Has rolling walker and 3 in 1 at home [facility] Expected Discharge Date:  09/18/18               Expected Discharge Plan:  Home/Self Care  In-House Referral:     Discharge planning Services  CM Consult  Post Acute Care Choice:    Choice offered to:     DME Arranged:    DME Agency:     HH Arranged:    [place] Agency:     Status of Service:  Completed, signed off  If discussed at [employer] of Stay Meetings, dates discussed:    Additional Comments:  Leeroy Cha, RN 09/18/2018, 9:45 AM

## 2018-09-18 NOTE — Progress Notes (Signed)
Physical Therapy Treatment Patient Details Name: Robert Carroll MRN: [id number] DOB: 1955-11-21 Today's Date: 09/18/2018    History of Present Illness Pt s/p L THR and with hx of R THR`    PT Comments    Pt progressing well with mobility and eager for dc home this date.  Reviewed home therex, stairs and car transfers.   Follow Up Recommendations  Follow surgeon's recommendation for DC plan and follow-up therapies     Equipment Recommendations  None recommended by PT    Recommendations for Other Services       Precautions / Restrictions Precautions Precautions: Fall Restrictions Weight Bearing Restrictions: No    Mobility  Bed Mobility Overal bed mobility: Needs Assistance Bed Mobility: Supine to Sit     Supine to sit: Min assist;Min guard     General bed mobility comments: Pt up in chair and requests back to same  Transfers Overall transfer level: Needs assistance Equipment used: Rolling walker (2 wheeled) Transfers: Sit to/from Stand Sit to Stand: Supervision         General transfer comment: cues for LE management and use of UEs to self assist  Ambulation/Gait Ambulation/Gait assistance: Min guard Gait Distance (Feet): 250 Feet Assistive device: Rolling walker (2 wheeled) Gait Pattern/deviations: Step-to pattern;Step-through pattern;Decreased step length - right;Decreased step length - left;Shuffle;Trunk flexed Gait velocity: decr   General Gait Details: cues for posture, position from RW and initial sequence   Stairs             Wheelchair Mobility    Modified Rankin (Stroke Patients Only)       Balance Overall balance assessment: Mild deficits observed, not formally tested                                          Cognition Arousal/Alertness: Awake/alert Behavior During Therapy: WFL for tasks assessed/performed Overall Cognitive Status: Within Functional Limits for tasks assessed                                        Exercises Total Joint Exercises Ankle Circles/Pumps: AROM;Both;15 reps;Supine Quad Sets: AROM;Both;10 reps;Supine Heel Slides: AAROM;Left;20 reps;Supine Hip ABduction/ADduction: AAROM;Left;15 reps;Supine Long Arc Quad: AAROM;AROM;Left;10 reps;Supine    General Comments        Pertinent Vitals/Pain Pain Assessment: 0-10 Pain Score: 4  Pain Location: L hip Pain Descriptors / Indicators: Aching;Sore Pain Intervention(s): Limited activity within patient's tolerance;Monitored during session;Premedicated before session    [place] expects to be discharged to:: Private residence Living Arrangements: Spouse/significant other Available Help at Discharge: Family Type of Home: House Home Access: Stairs to enter Entrance Stairs-Rails: Right Home Layout: Able to live on main level with bedroom/bathroom;Two level Home Equipment: Walker - 2 wheels;Cane - single point;Bedside commode      Prior Function Level of Independence: Independent          PT Goals (current goals can now be found in the care plan section) Acute Rehab PT Goals Patient Stated Goal: Regain IND PT Goal Formulation: With patient Time For Goal Achievement: 09/19/18 Potential to Achieve Goals: Good Progress towards PT goals: Progressing toward goals    Frequency    7X/week      PT Plan Current plan remains appropriate    Co-evaluation  AM-PAC PT "6 Clicks" Mobility   Outcome Measure  Help needed turning from your back to your side while in a flat bed without using bedrails?: A Little Help needed moving from lying on your back to sitting on the side of a flat bed without using bedrails?: A Little Help needed moving to and from a bed to a chair (including a wheelchair)?: A Little Help needed standing up from a chair using your arms (e.g., wheelchair or bedside chair)?: A Little Help needed to walk in hospital room?: A Little Help needed climbing 3-5  steps with a railing? : A Little 6 Click Score: 18    End of Session Equipment Utilized During Treatment: Gait belt Activity Tolerance: Patient tolerated treatment well Patient left: in chair;with call bell/phone within reach;with family/visitor present Nurse Communication: Mobility status PT Visit Diagnosis: Difficulty in walking, not elsewhere classified (R26.2)     Time: 2863-8177 PT Time Calculation (min) (ACUTE ONLY): 19 min  Charges:  $Gait Training: 8-22 mins $Therapeutic Exercise: 8-22 mins                     [place] Pager [phone] Office [phone]    Kalaya Infantino 09/18/2018, 12:46 PM

## 2018-09-19 ENCOUNTER — Encounter (HOSPITAL_COMMUNITY): Payer: Self-pay | Admitting: Orthopedic Surgery

## 2018-10-02 DIAGNOSIS — M1611 Unilateral primary osteoarthritis, right hip: Secondary | ICD-10-CM | POA: Diagnosis not present

## 2018-10-02 DIAGNOSIS — Z96642 Presence of left artificial hip joint: Secondary | ICD-10-CM | POA: Diagnosis not present

## 2018-10-02 DIAGNOSIS — Z471 Aftercare following joint replacement surgery: Secondary | ICD-10-CM | POA: Diagnosis not present

## 2018-10-02 DIAGNOSIS — G4733 Obstructive sleep apnea (adult) (pediatric): Secondary | ICD-10-CM | POA: Diagnosis not present

## 2018-10-30 DIAGNOSIS — Z96642 Presence of left artificial hip joint: Secondary | ICD-10-CM | POA: Diagnosis not present

## 2018-10-30 DIAGNOSIS — Z471 Aftercare following joint replacement surgery: Secondary | ICD-10-CM | POA: Diagnosis not present

## 2018-11-02 DIAGNOSIS — G4733 Obstructive sleep apnea (adult) (pediatric): Secondary | ICD-10-CM | POA: Diagnosis not present

## 2018-11-02 DIAGNOSIS — M1611 Unilateral primary osteoarthritis, right hip: Secondary | ICD-10-CM | POA: Diagnosis not present

## 2018-11-05 DIAGNOSIS — G4733 Obstructive sleep apnea (adult) (pediatric): Secondary | ICD-10-CM | POA: Diagnosis not present

## 2018-11-08 DIAGNOSIS — G4733 Obstructive sleep apnea (adult) (pediatric): Secondary | ICD-10-CM | POA: Diagnosis not present

## 2018-11-15 DIAGNOSIS — R3915 Urgency of urination: Secondary | ICD-10-CM | POA: Diagnosis not present

## 2018-11-15 DIAGNOSIS — N401 Enlarged prostate with lower urinary tract symptoms: Secondary | ICD-10-CM | POA: Diagnosis not present

## 2018-11-22 DIAGNOSIS — R3912 Poor urinary stream: Secondary | ICD-10-CM | POA: Diagnosis not present

## 2018-11-22 DIAGNOSIS — N401 Enlarged prostate with lower urinary tract symptoms: Secondary | ICD-10-CM | POA: Diagnosis not present

## 2018-12-02 DIAGNOSIS — M1611 Unilateral primary osteoarthritis, right hip: Secondary | ICD-10-CM | POA: Diagnosis not present

## 2018-12-02 DIAGNOSIS — G4733 Obstructive sleep apnea (adult) (pediatric): Secondary | ICD-10-CM | POA: Diagnosis not present

## 2018-12-11 DIAGNOSIS — Z96642 Presence of left artificial hip joint: Secondary | ICD-10-CM | POA: Diagnosis not present

## 2018-12-11 DIAGNOSIS — Z471 Aftercare following joint replacement surgery: Secondary | ICD-10-CM | POA: Diagnosis not present

## 2019-03-19 ENCOUNTER — Other Ambulatory Visit: Payer: Self-pay | Admitting: Urology

## 2019-04-06 IMAGING — DX DG HIP (WITH OR WITHOUT PELVIS) 1V PORT*R*
2 series · 2 of 2 positions shown · non-contrast
Comparison: None.

CLINICAL DATA: Status post right anterior approach total hip
replacement

EXAM:
DG HIP (WITH OR WITHOUT PELVIS) 1V PORT RIGHT

[pelvis ap]
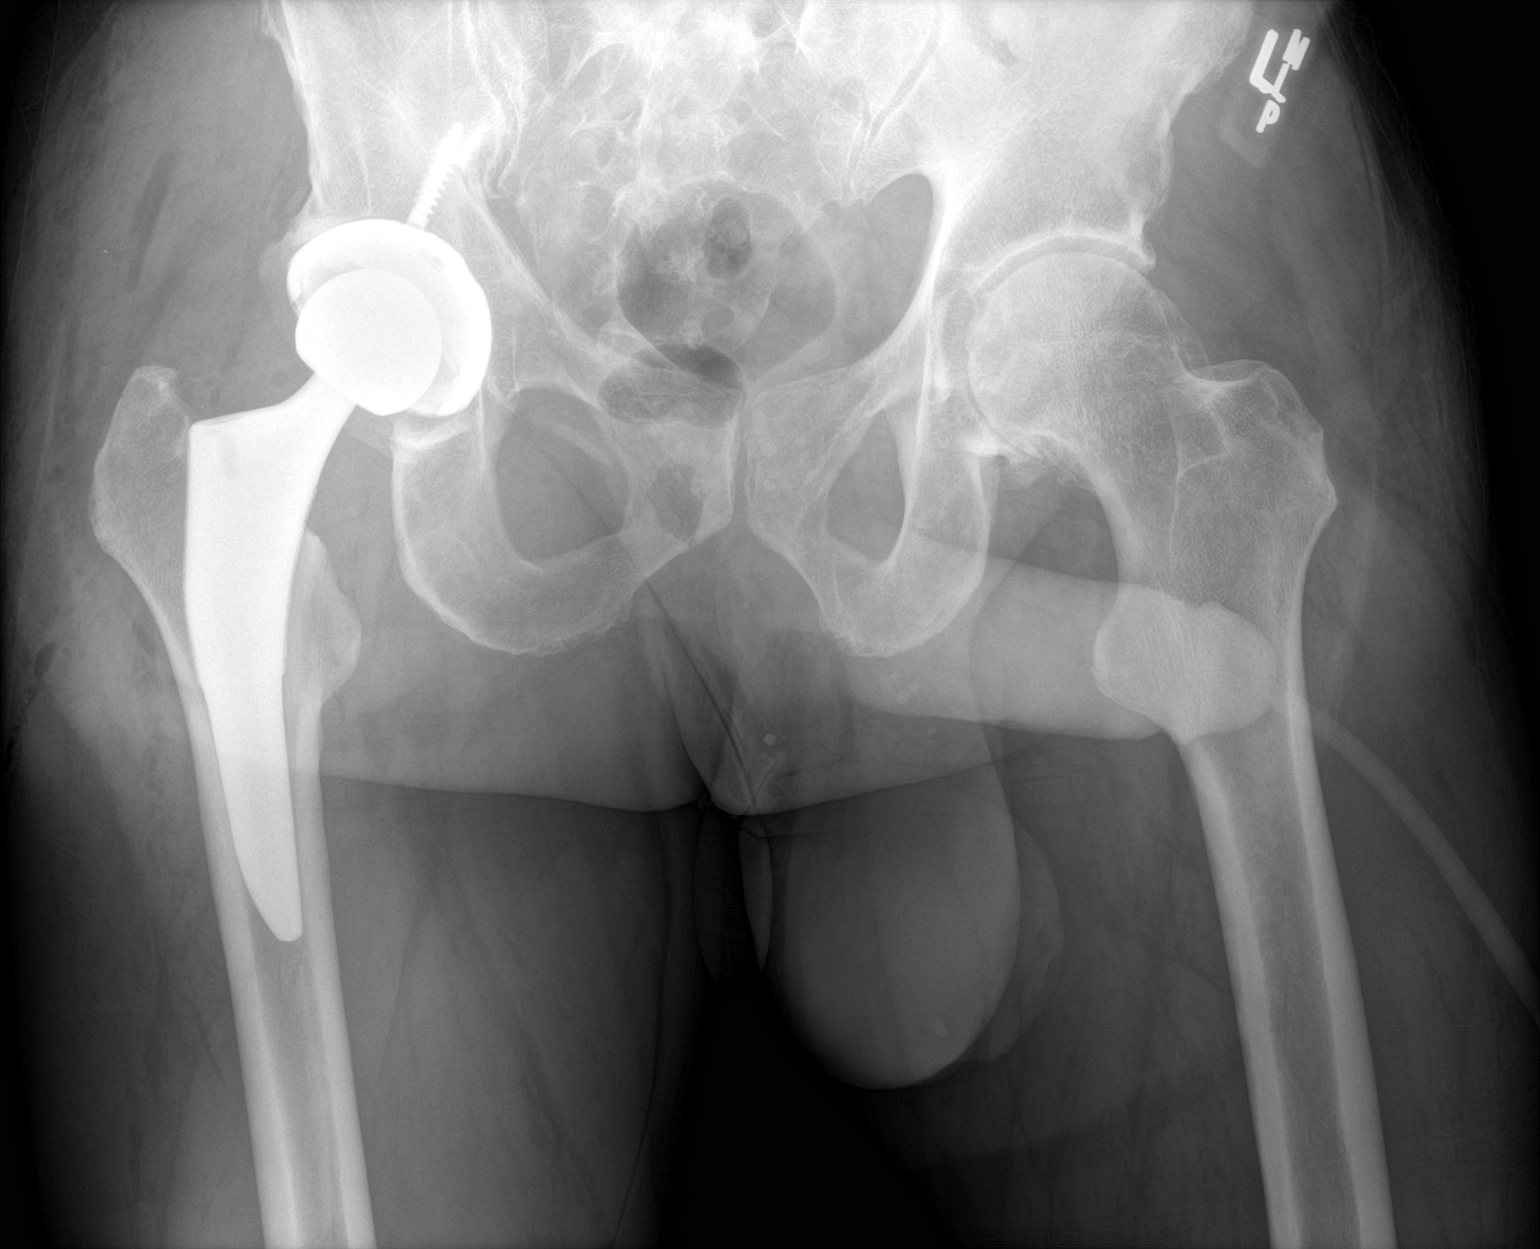

[hip lat]
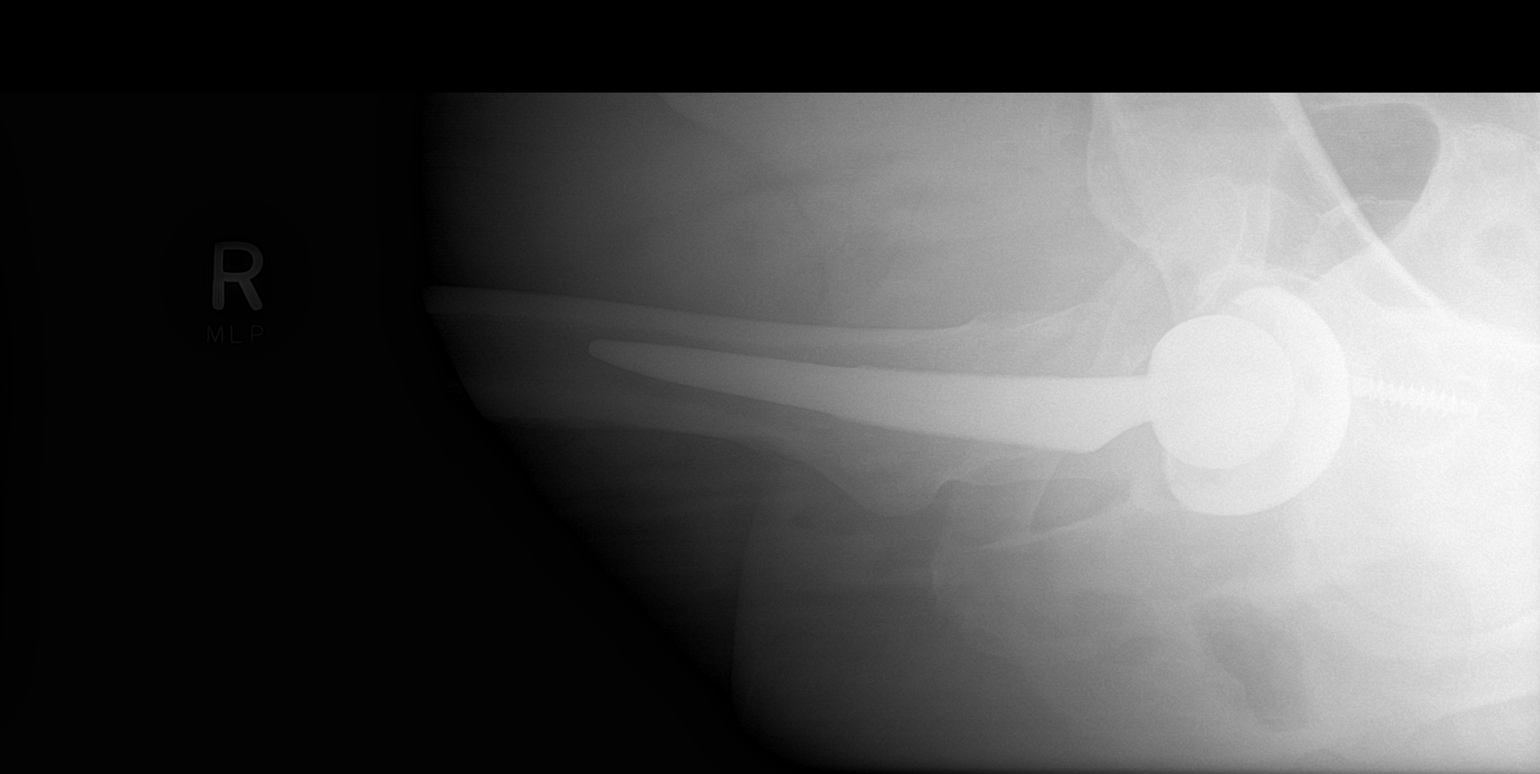

[2 of 2 positions shown; findings below may reference images not displayed]

FINDINGS: Right total hip arthroplasty, without evidence of complication.

Mild degenerative changes the left hip.

No fracture or dislocation is seen.
IMPRESSION: Right total hip arthroplasty, without evidence of complication.

## 2019-04-15 ENCOUNTER — Other Ambulatory Visit: Payer: Self-pay

## 2019-04-15 ENCOUNTER — Other Ambulatory Visit (HOSPITAL_COMMUNITY)
Admission: RE | Admit: 2019-04-15 | Discharge: 2019-04-15 | Disposition: A | Discharge status: Home / Self Care | Payer: 59 | Source: Ambulatory Visit | Attending: Urology | Admitting: Urology

## 2019-04-15 ENCOUNTER — Encounter (HOSPITAL_BASED_OUTPATIENT_CLINIC_OR_DEPARTMENT_OTHER): Payer: Self-pay

## 2019-04-15 DIAGNOSIS — Z01812 Encounter for preprocedural laboratory examination: Secondary | ICD-10-CM | POA: Insufficient documentation

## 2019-04-15 DIAGNOSIS — Z20828 Contact with and (suspected) exposure to other viral communicable diseases: Secondary | ICD-10-CM | POA: Insufficient documentation

## 2019-04-15 NOTE — Progress Notes (Signed)
SPOKE W/  Stein     SCREENING SYMPTOMS OF COVID 19:   COUGH--NO  RUNNY NOSE--- NO  SORE THROAT---NO  NASAL CONGESTION----NO  SNEEZING----NO  SHORTNESS OF BREATH---NO  DIFFICULTY BREATHING---NO  TEMP >100.0 -----NO  UNEXPLAINED BODY ACHES------NO  CHILLS -------- NO  HEADACHES ---------NO  LOSS OF SMELL/ TASTE --------NO    HAVE YOU OR ANY FAMILY MEMBER TRAVELLED PAST 14 DAYS OUT OF THE   COUNTY---NO STATE----NO COUNTRY----NO  HAVE YOU OR ANY FAMILY MEMBER BEEN EXPOSED TO ANYONE WITH COVID 19? NO

## 2019-04-15 NOTE — Progress Notes (Signed)
Spoke with: Sevyn NPO:  No food after midnight/Clear liquids until 7:30 AM DOS Arrival time: 1130AM Labs: Istat 8 AM medications: Doxazosin, Silodosin, Valacyclovir Pre op orders: Yes Ride home:  [place] ([id number] Asked to bring CPAP machine

## 2019-04-16 LAB — NOVEL CORONAVIRUS, NAA (HOSP ORDER, SEND-OUT TO REF LAB; TAT 18-24 HRS): SARS-CoV-2, NAA: NOT DETECTED

## 2019-04-18 ENCOUNTER — Encounter (HOSPITAL_BASED_OUTPATIENT_CLINIC_OR_DEPARTMENT_OTHER): Payer: Self-pay | Admitting: *Deleted

## 2019-04-18 ENCOUNTER — Ambulatory Visit (HOSPITAL_BASED_OUTPATIENT_CLINIC_OR_DEPARTMENT_OTHER): Payer: 59 | Admitting: Anesthesiology

## 2019-04-18 ENCOUNTER — Other Ambulatory Visit: Payer: Self-pay

## 2019-04-18 ENCOUNTER — Ambulatory Visit (HOSPITAL_BASED_OUTPATIENT_CLINIC_OR_DEPARTMENT_OTHER)
Admission: RE | Admit: 2019-04-18 | Discharge: 2019-04-18 | Disposition: A | Discharge status: Home / Self Care | Payer: 59 | Attending: Urology | Admitting: Urology

## 2019-04-18 ENCOUNTER — Encounter (HOSPITAL_BASED_OUTPATIENT_CLINIC_OR_DEPARTMENT_OTHER)
Admission: RE | Disposition: A | Discharge status: Home / Self Care | Payer: Self-pay | Source: Home / Self Care | Attending: Urology

## 2019-04-18 DIAGNOSIS — Z791 Long term (current) use of non-steroidal anti-inflammatories (NSAID): Secondary | ICD-10-CM | POA: Insufficient documentation

## 2019-04-18 DIAGNOSIS — G4733 Obstructive sleep apnea (adult) (pediatric): Secondary | ICD-10-CM | POA: Diagnosis not present

## 2019-04-18 DIAGNOSIS — R3912 Poor urinary stream: Secondary | ICD-10-CM | POA: Insufficient documentation

## 2019-04-18 DIAGNOSIS — N138 Other obstructive and reflux uropathy: Secondary | ICD-10-CM

## 2019-04-18 DIAGNOSIS — N401 Enlarged prostate with lower urinary tract symptoms: Secondary | ICD-10-CM | POA: Diagnosis present

## 2019-04-18 DIAGNOSIS — E785 Hyperlipidemia, unspecified: Secondary | ICD-10-CM | POA: Diagnosis not present

## 2019-04-18 DIAGNOSIS — Z79899 Other long term (current) drug therapy: Secondary | ICD-10-CM | POA: Insufficient documentation

## 2019-04-18 DIAGNOSIS — Z96643 Presence of artificial hip joint, bilateral: Secondary | ICD-10-CM | POA: Diagnosis not present

## 2019-04-18 DIAGNOSIS — I10 Essential (primary) hypertension: Secondary | ICD-10-CM | POA: Insufficient documentation

## 2019-04-18 DIAGNOSIS — M1990 Unspecified osteoarthritis, unspecified site: Secondary | ICD-10-CM | POA: Diagnosis not present

## 2019-04-18 DIAGNOSIS — N368 Other specified disorders of urethra: Secondary | ICD-10-CM | POA: Insufficient documentation

## 2019-04-18 DIAGNOSIS — K219 Gastro-esophageal reflux disease without esophagitis: Secondary | ICD-10-CM | POA: Diagnosis not present

## 2019-04-18 HISTORY — DX: Obstructive sleep apnea (adult) (pediatric): G47.33

## 2019-04-18 HISTORY — PX: THULIUM LASER TURP (TRANSURETHRAL RESECTION OF PROSTATE): SHX6744

## 2019-04-18 HISTORY — DX: Dependence on other enabling machines and devices: Z99.89

## 2019-04-18 HISTORY — DX: Gastro-esophageal reflux disease without esophagitis: K21.9

## 2019-04-18 HISTORY — DX: Unspecified osteoarthritis, unspecified site: M19.90

## 2019-04-18 HISTORY — DX: Benign prostatic hyperplasia without lower urinary tract symptoms: N40.0

## 2019-04-18 LAB — POCT I-STAT, CHEM 8
BUN: 15 mg/dL (ref 8–23)
Calcium, Ion: 1.23 mmol/L (ref 1.15–1.40)
Chloride: 108 mmol/L (ref 98–111)
Creatinine, Ser: 0.9 mg/dL (ref 0.61–1.24)
Glucose, Bld: 95 mg/dL (ref 70–99)
HCT: 40 % (ref 39.0–52.0)
Hemoglobin: 13.6 g/dL (ref 13.0–17.0)
Potassium: 3.9 mmol/L (ref 3.5–5.1)
Sodium: 142 mmol/L (ref 135–145)
TCO2: 20 mmol/L — ABNORMAL LOW (ref 22–32)

## 2019-04-18 SURGERY — THULIUM LASER TURP (TRANSURETHRAL RESECTION OF PROSTATE)
Anesthesia: General | Site: Prostate

## 2019-04-18 MED ORDER — PROPOFOL 10 MG/ML IV BOLUS
INTRAVENOUS | Status: DC | PRN
  Administered 2019-04-18: 100 mg via INTRAVENOUS
  Administered 2019-04-18: 200 mg via INTRAVENOUS

## 2019-04-18 MED ORDER — ONDANSETRON HCL 4 MG/2ML IJ SOLN
INTRAMUSCULAR | Status: AC
  Filled 2019-04-18: qty 2

## 2019-04-18 MED ORDER — PROPOFOL 10 MG/ML IV BOLUS
INTRAVENOUS | Status: AC
  Filled 2019-04-18: qty 20

## 2019-04-18 MED ORDER — DEXAMETHASONE SODIUM PHOSPHATE 10 MG/ML IJ SOLN
INTRAMUSCULAR | Status: AC
  Filled 2019-04-18: qty 1

## 2019-04-18 MED ORDER — FENTANYL CITRATE (PF) 100 MCG/2ML IJ SOLN
INTRAMUSCULAR | Status: AC
  Filled 2019-04-18: qty 2

## 2019-04-18 MED ORDER — FENTANYL CITRATE (PF) 100 MCG/2ML IJ SOLN
INTRAMUSCULAR | Status: DC | PRN
  Administered 2019-04-18: 50 ug via INTRAVENOUS
  Administered 2019-04-18 (×6): 25 ug via INTRAVENOUS

## 2019-04-18 MED ORDER — BELLADONNA ALKALOIDS-OPIUM 16.2-60 MG RE SUPP
RECTAL | Status: AC
  Filled 2019-04-18: qty 1

## 2019-04-18 MED ORDER — LIDOCAINE HCL (CARDIAC) PF 100 MG/5ML IV SOSY
PREFILLED_SYRINGE | INTRAVENOUS | Status: DC | PRN
  Administered 2019-04-18: 100 mg via INTRAVENOUS

## 2019-04-18 MED ORDER — ACETAMINOPHEN 500 MG PO TABS
1000.0000 mg | ORAL_TABLET | Freq: Once | ORAL | Status: DC | PRN
  Filled 2019-04-18: qty 2

## 2019-04-18 MED ORDER — SODIUM CHLORIDE 0.9 % IR SOLN
Status: DC | PRN
  Administered 2019-04-18: 6000 mL

## 2019-04-18 MED ORDER — LACTATED RINGERS IV SOLN
INTRAVENOUS | Status: DC
  Administered 2019-04-18 (×2): via INTRAVENOUS
  Filled 2019-04-18: qty 1000

## 2019-04-18 MED ORDER — PROPOFOL 500 MG/50ML IV EMUL
INTRAVENOUS | Status: AC
  Filled 2019-04-18: qty 50

## 2019-04-18 MED ORDER — CEPHALEXIN 500 MG PO CAPS: 500.0000 mg | ORAL_CAPSULE | Freq: Every day | ORAL | 0 refills | Status: DC

## 2019-04-18 MED ORDER — MIDAZOLAM HCL 5 MG/5ML IJ SOLN
INTRAMUSCULAR | Status: DC | PRN
  Administered 2019-04-18: 2 mg via INTRAVENOUS

## 2019-04-18 MED ORDER — DEXAMETHASONE SODIUM PHOSPHATE 4 MG/ML IJ SOLN
INTRAMUSCULAR | Status: DC | PRN
  Administered 2019-04-18: 10 mg via INTRAVENOUS

## 2019-04-18 MED ORDER — OXYCODONE HCL 5 MG PO TABS
5.0000 mg | ORAL_TABLET | Freq: Once | ORAL | Status: DC | PRN
  Filled 2019-04-18: qty 1

## 2019-04-18 MED ORDER — LIDOCAINE 2% (20 MG/ML) 5 ML SYRINGE
INTRAMUSCULAR | Status: AC
  Filled 2019-04-18: qty 5

## 2019-04-18 MED ORDER — ACETAMINOPHEN 160 MG/5ML PO SOLN
1000.0000 mg | Freq: Once | ORAL | Status: DC | PRN
  Filled 2019-04-18: qty 40.6

## 2019-04-18 MED ORDER — MIDAZOLAM HCL 2 MG/2ML IJ SOLN
INTRAMUSCULAR | Status: AC
  Filled 2019-04-18: qty 2

## 2019-04-18 MED ORDER — FENTANYL CITRATE (PF) 100 MCG/2ML IJ SOLN
25.0000 ug | INTRAMUSCULAR | Status: DC | PRN
  Filled 2019-04-18: qty 1

## 2019-04-18 MED ORDER — ONDANSETRON HCL 4 MG/2ML IJ SOLN
INTRAMUSCULAR | Status: DC | PRN
  Administered 2019-04-18: 4 mg via INTRAVENOUS

## 2019-04-18 MED ORDER — OXYCODONE HCL 5 MG/5ML PO SOLN
5.0000 mg | Freq: Once | ORAL | Status: DC | PRN
  Filled 2019-04-18: qty 5

## 2019-04-18 MED ORDER — KETOROLAC TROMETHAMINE 30 MG/ML IJ SOLN
INTRAMUSCULAR | Status: AC
  Filled 2019-04-18: qty 1

## 2019-04-18 MED ORDER — ACETAMINOPHEN 10 MG/ML IV SOLN
1000.0000 mg | Freq: Once | INTRAVENOUS | Status: DC | PRN
  Filled 2019-04-18: qty 100

## 2019-04-18 MED ORDER — CEFAZOLIN SODIUM-DEXTROSE 2-4 GM/100ML-% IV SOLN
2.0000 g | Freq: Once | INTRAVENOUS | Status: AC
  Administered 2019-04-18: 2 g via INTRAVENOUS
  Filled 2019-04-18: qty 100

## 2019-04-18 MED ORDER — KETOROLAC TROMETHAMINE 30 MG/ML IJ SOLN
INTRAMUSCULAR | Status: DC | PRN
  Administered 2019-04-18: 30 mg via INTRAVENOUS

## 2019-04-18 MED ORDER — BELLADONNA ALKALOIDS-OPIUM 16.2-60 MG RE SUPP
RECTAL | Status: DC | PRN
  Administered 2019-04-18: 1 via RECTAL

## 2019-04-18 MED ORDER — CEFAZOLIN SODIUM-DEXTROSE 2-4 GM/100ML-% IV SOLN
INTRAVENOUS | Status: AC
  Filled 2019-04-18: qty 100

## 2019-04-18 SURGICAL SUPPLY — 18 items
BAG DRAIN URO-CYSTO SKYTR STRL (DRAIN) ×2 IMPLANT
BAG URINE DRAINAGE (UROLOGICAL SUPPLIES) ×2 IMPLANT
CATH COUDE FOLEY 2W 5CC 18FR (CATHETERS) ×1 IMPLANT
CATH FOLEY 3WAY 30CC 22F (CATHETERS) IMPLANT
CLOTH BEACON ORANGE TIMEOUT ST (SAFETY) ×2 IMPLANT
GLOVE BIO SURGEON STRL SZ7.5 (GLOVE) ×2 IMPLANT
GLOVE BIO SURGEON STRL SZ8 (GLOVE) IMPLANT
GOWN STRL REUS W/TWL XL LVL3 (GOWN DISPOSABLE) ×2 IMPLANT
HOLDER FOLEY CATH W/STRAP (MISCELLANEOUS) ×1 IMPLANT
IV NS IRRIG 3000ML ARTHROMATIC (IV SOLUTION) ×4 IMPLANT
KIT TURNOVER CYSTO (KITS) ×2 IMPLANT
LASER REVOLIX PROCEDURE (MISCELLANEOUS) ×2 IMPLANT
LOOP CUT BIPOLAR 24F LRG (ELECTROSURGICAL) IMPLANT
MANIFOLD NEPTUNE II (INSTRUMENTS) ×2 IMPLANT
PACK CYSTO (CUSTOM PROCEDURE TRAY) ×2 IMPLANT
SYR 30ML LL (SYRINGE) IMPLANT
TUBE CONNECTING 12X1/4 (SUCTIONS) ×1 IMPLANT
TUBING UROLOGY SET (TUBING) ×1 IMPLANT

## 2019-04-18 NOTE — Discharge Instructions (Signed)
Post Anesthesia Home Care Instructions  Activity: Get plenty of rest for the remainder of the day. A responsible adult should stay with you for 24 hours following the procedure.  For the next 24 hours, DO NOT: -Drive a car -Paediatric nurse -Drink alcoholic beverages -Take any medication unless instructed by your physician -Make any legal decisions or sign important papers.  Meals: Start with liquid foods such as gelatin or soup. Progress to regular foods as tolerated. Avoid greasy, spicy, heavy foods. If nausea and/or vomiting occur, drink only clear liquids until the nausea and/or vomiting subsides. Call your physician if vomiting continues.  Special Instructions/Symptoms: Your throat may feel dry or sore from the anesthesia or the breathing tube placed in your throat during surgery. If this causes discomfort, gargle with warm salt water. The discomfort should disappear within 24 hours.  If you had a scopolamine patch placed behind your ear for the management of post- operative nausea and/or vomiting:  1. The medication in the patch is effective for 72 hours, after which it should be removed.  Wrap patch in a tissue and discard in the trash. Wash hands thoroughly with soap and water. 2. You may remove the patch earlier than 72 hours if you experience unpleasant side effects which may include dry mouth, dizziness or visual disturbances. 3. Avoid touching the patch. Wash your hands with soap and water after contact with the patch.   Indwelling Urinary Catheter Care, Adult An indwelling urinary catheter is a thin tube that is put into your bladder. The tube helps to drain pee (urine) out of your body. The tube goes in through your urethra. Your urethra is where pee comes out of your body. Your pee will come out through the catheter, then it will go into a bag (drainage bag). Take good care of your catheter so it will work well. How to wear your catheter and bag Supplies needed  Sticky  tape (adhesive tape) or a leg strap.  Alcohol wipe or soap and water (if you use tape).  A clean towel (if you use tape).  Large overnight bag.  Smaller bag (leg bag). Wearing your catheter Attach your catheter to your leg with tape or a leg strap.  Make sure the catheter is not pulled tight.  If a leg strap gets wet, take it off and put on a dry strap.  If you use tape to hold the bag on your leg: 1. Use an alcohol wipe or soap and water to wash your skin where the tape made it sticky before. 2. Use a clean towel to pat-dry that skin. 3. Use new tape to make the bag stay on your leg. Wearing your bags You should have been given a large overnight bag.  You may wear the overnight bag in the day or night.  Always have the overnight bag lower than your bladder.  Do not let the bag touch the floor.  Before you go to sleep, put a clean plastic bag in a wastebasket. Then hang the overnight bag inside the wastebasket. You should also have a smaller leg bag that fits under your clothes.  Always wear the leg bag below your knee.  Do not wear your leg bag at night. How to care for your skin and catheter Supplies needed  A clean washcloth.  Water and mild soap.  A clean towel. Caring for your skin and catheter      Clean the skin around your catheter every day: ? Wash your  hands with soap and water. ? Wet a clean washcloth in warm water and mild soap. ? Clean the skin around your urethra. ? If you are male: ? Gently spread the folds of skin around your vagina (labia). ? With the washcloth in your other hand, wipe the inner side of your labia on each side. Wipe from front to back. ? If you are male: ? Pull back any skin that covers the end of your penis (foreskin). ? With the washcloth in your other hand, wipe your penis in small circles. Start wiping at the tip of your penis, then move away from the catheter. ? Move the foreskin back in place, if needed. ? With your  free hand, hold the catheter close to where it goes into your body. ? Keep holding the catheter during cleaning so it does not get pulled out. ? With the washcloth in your other hand, clean the catheter. ? Only wipe downward on the catheter. ? Do not wipe upward toward your body. Doing this may push germs into your urethra and cause infection. ? Use a clean towel to pat-dry the catheter and the skin around it. Make sure to wipe off all soap. ? Wash your hands with soap and water.  Shower every day. Do not take baths.  Do not use cream, ointment, or lotion on the area where the catheter goes into your body, unless your doctor tells you to.  Do not use powders, sprays, or lotions on your genital area.  Check your skin around the catheter every day for signs of infection. Check for: ? Redness, swelling, or pain. ? Fluid or blood. ? Warmth. ? Pus or a bad smell. How to empty the bag Supplies needed  Rubbing alcohol.  Gauze pad or cotton ball.  Tape or a leg strap. Emptying the bag Pour the pee out of your bag when it is ?- full, or at least 2-3 times a day. Do this for your overnight bag and your leg bag. 1. Wash your hands with soap and water. 2. Separate (detach) the bag from your leg. 3. Hold the bag over the toilet or a clean pail. Keep the bag lower than your hips and bladder. This is so the pee (urine) does not go back into the tube. 4. Open the pour spout. It is at the bottom of the bag. 5. Empty the pee into the toilet or pail. Do not let the pour spout touch any surface. 6. Put rubbing alcohol on a gauze pad or cotton ball. 7. Use the gauze pad or cotton ball to clean the pour spout. 8. Close the pour spout. 9. Attach the bag to your leg with tape or a leg strap. 10. Wash your hands with soap and water. Follow instructions for cleaning the drainage bag:  From the product maker.  As told by your doctor. How to change the bag Supplies needed  Alcohol wipes.  A  clean bag.  Tape or a leg strap. Changing the bag Replace your bag when it starts to leak, smell bad, or look dirty. 1. Wash your hands with soap and water. 2. Separate the dirty bag from your leg. 3. Pinch the catheter with your fingers so that pee does not spill out. 4. Separate the catheter tube from the bag tube where these tubes connect (at the connection valve). Do not let the tubes touch any surface. 5. Clean the end of the catheter tube with an alcohol wipe. Use a different alcohol  wipe to clean the end of the bag tube. 6. Connect the catheter tube to the tube of the clean bag. 7. Attach the clean bag to your leg with tape or a leg strap. Do not make the bag tight on your leg. 8. Wash your hands with soap and water. General rules   Never pull on your catheter. Never try to take it out. Doing that can hurt you.  Always wash your hands before and after you touch your catheter or bag. Use a mild, fragrance-free soap. If you do not have soap and water, use hand sanitizer.  Always make sure there are no twists or bends (kinks) in the catheter tube.  Always make sure there are no leaks in the catheter or bag.  Drink enough fluid to keep your pee pale yellow.  Do not take baths, swim, or use a hot tub.  If you are male, wipe from front to back after you poop (have a bowel movement). Contact a doctor if:  Your pee is cloudy.  Your pee smells worse than usual.  Your catheter gets clogged.  Your catheter leaks.  Your bladder feels full. Get help right away if:  You have redness, swelling, or pain where the catheter goes into your body.  You have fluid, blood, pus, or a bad smell coming from the area where the catheter goes into your body.  Your skin feels warm where the catheter goes into your body.  You have a fever.  You have pain in your: ? Belly (abdomen). ? Legs. ? Lower back. ? Bladder.  You see blood in the catheter.  Your pee is pink or red.  You  feel sick to your stomach (nauseous).  You throw up (vomit).  You have chills.  Your pee is not draining into the bag.  Your catheter gets pulled out. Summary  An indwelling urinary catheter is a thin tube that is placed into the bladder to help drain pee (urine) out of the body.  The catheter is placed into the part of the body that drains pee from the bladder (urethra).  Taking good care of your catheter will keep it working properly and help prevent problems.  Always wash your hands before and after touching your catheter or bag.  Never pull on your catheter or try to take it out. This information is not intended to replace advice given to you by your health care provider. Make sure you discuss any questions you have with your health care provider. Document Released: 11/04/2012 Document Revised: 11/01/2018 Document Reviewed: 02/23/2017 Elsevier Patient Education  2020 [facility], Care After  This sheet gives you information about how to care for yourself after your procedure. Your health care provider may also give you more specific instructions. If you have problems or questions, contact your health care provider. What can I expect after the procedure? For the first few weeks after the procedure:  You will feel a need to urinate often.  You may have blood in your urine.  You may feel a sudden need to urinate. Once your urinary catheter is removed, you may have a burning feeling when you urinate, especially at the end of urination. This feeling usually passes within 3-5 days. Follow these instructions at home: Activity  Return to your normal activities as told by your health care provider. Ask your health care provider what activities are safe for you.  Do not do vigorous exercise for 1 week or  as told by your health care provider.  Do not lift anything that is heavier than 10 lb (4.5 kg) until your health care provider say it is  safe.  Avoid sexual activity for 4-6 weeks or as told by your health care provider.  Do not ride in a car for extended periods of time for 1 month or as told by your health care provider.  Do not drive for 24 hours if you were given a medicine to help you relax (sedative). Diet  Eat foods that are high in fiber, such as fresh fruits and vegetables, whole grains, and beans.  Drink enough fluid to keep your urine clear or pale yellow. Medicines  Take over-the-counter and prescription medicines, including stool softeners, only as told by your health care provider.  If you were prescribed an antibiotic medicine, take it as told by your health care provider. Do not stop taking the antibiotic even if you start to feel better. General instructions   If you were given elastic support stockings, wear them as told by your health care provider.  Do not strain to have a bowel movement. Straining may lead to bleeding from the prostate and cause clots to form and cause trouble urinating.  Keep all follow-up visits as told by your health care provider. This is important. Contact a health care provider if:  You have a fever or chills.  You have spasms or pain with the urinary catheter still in place.  Once the catheter has been removed, you experience difficulty starting your stream when attempting to urinate. Get help right away if:  There is a blockage in your catheter.  Your catheter has been removed and you are suddenly unable to urinate.  Your urine smells unusually bad.  You start to have blood clots in your urine.  The blood in your urine becomes persistent or gets thick.  You develop chest pains.  You develop shortness of breath.  You develop swelling or pain in your leg. Summary  You may notice urinary symptoms for a few weeks after your procedure.  Follow instructions from your health care provider regarding activity restrictions such as lifting, exercise, and sexual  activity.  Contact your health care provider if you have any unusual symptoms during your recovery. This information is not intended to replace advice given to you by your health care provider. Make sure you discuss any questions you have with your health care provider. Document Released: 07/10/2005 Document Revised: 06/22/2017 Document Reviewed: 02/25/2016 Elsevier Patient Education  2020 [employer].

## 2019-04-18 NOTE — Anesthesia Procedure Notes (Signed)
Procedure Name: LMA Insertion Date/Time: 04/18/2019 1:02 PM Performed by: Justice Rocher, CRNA Pre-anesthesia Checklist: Patient identified, Emergency Drugs available, Suction available and Patient being monitored Patient Re-evaluated:Patient Re-evaluated prior to induction Oxygen Delivery Method: Circle system utilized Preoxygenation: Pre-oxygenation with 100% oxygen Induction Type: IV induction Ventilation: Mask ventilation without difficulty LMA: LMA inserted LMA Size: 5.0 Number of attempts: 1 Airway Equipment and Method: Bite block Placement Confirmation: positive ETCO2 and breath sounds checked- equal and bilateral Tube secured with: Tape Dental Injury: Teeth and Oropharynx as per pre-operative assessment

## 2019-04-18 NOTE — H&P (Addendum)
H&P  Chief Complaint: BPH with lower urinary tract symptoms, weak stream  History of Present Illness: [name] is a 63 year old male with a history of BPH, frequency and weak stream.  He has done well on alpha blockers but wishes to get off the medication.  He was noted to have a high bladder neck and a cyst at the bladder neck on the anterior prostatic urethra that may be causing some obstruction on recent office cystoscopy.  His PSA was 0.45.  He recently started silodosin and has done well with a better stream but hopes to get off the medication.  He has been well without any dysuria or fever.  Past Medical History:  Diagnosis Date  . BPH (benign prostatic hyperplasia)   . Colon polyp    adenomatous, 2008  . Genital herpes   . GERD (gastroesophageal reflux disease)   . Hyperlipidemia   . Hypertension   . OA (osteoarthritis)   . OSA on CPAP    on CPAP   Past Surgical History:  Procedure Laterality Date  . BUNIONECTOMY Bilateral   . COLONOSCOPY   2013  . EYE SURGERY     LASIK 2 YEARS AGO   . [place]   right  . TOTAL HIP ARTHROPLASTY Right 12/26/2016   Procedure: RIGHT TOTAL HIP ARTHROPLASTY ANTERIOR APPROACH;  Surgeon: Paralee Cancel, MD;  Location: [facility];  Service: Orthopedics;  Laterality: Right;  . TOTAL HIP ARTHROPLASTY Left 09/17/2018   Procedure: TOTAL HIP ARTHROPLASTY ANTERIOR APPROACH;  Surgeon: Paralee Cancel, MD;  Location: [facility];  Service: Orthopedics;  Laterality: Left;  70 minutes    Home Medications:  Medications Prior to Admission  Medication Sig Dispense Refill Last Dose  . amLODipine-benazepril (LOTREL) 10-20 MG capsule Take 1 capsule by mouth at bedtime.    04/17/2019 at Unknown time  . atorvastatin (LIPITOR) 20 MG tablet Take 20 mg by mouth every evening.    04/17/2019 at Unknown time  . cabergoline (DOSTINEX) 0.5 MG tablet Take 0.25 mg by mouth 2 (two) times a week. Monday and Friday morning   Past Week at Unknown time  . diclofenac  (VOLTAREN) 75 MG EC tablet Take 75 mg by mouth 2 (two) times daily.   04/17/2019 at Unknown time  . diphenhydrAMINE (BENADRYL) 25 MG tablet Take 25 mg by mouth daily.   04/17/2019 at Unknown time  . doxazosin (CARDURA) 4 MG tablet Take 4 mg by mouth every morning.    04/18/2019 at 0800  . fluticasone (FLONASE) 50 MCG/ACT nasal spray Place 2 sprays into both nostrils as needed for allergies or rhinitis.    04/17/2019 at Unknown time  . gabapentin (NEURONTIN) 300 MG capsule TAKE 3 CAPSULES BY MOUTH 3  TIMES DAILY (Patient taking differently: Take 900 mg by mouth 3 (three) times daily. ) 810 capsule 0 04/17/2019 at Unknown time  . Multiple Vitamin (MULTIVITAMIN WITH MINERALS) TABS tablet Take 1 tablet by mouth daily.   Past Month at Unknown time  . Omega-3 Fatty Acids (FISH OIL) 1000 MG CAPS Take 1,000 mg by mouth daily.    Past Month at Unknown time  . omeprazole (PRILOSEC) 20 MG capsule Take 20 mg by mouth daily before supper.    04/17/2019 at Unknown time  . silodosin (RAPAFLO) 4 MG CAPS capsule Take 4 mg by mouth every morning.   04/18/2019 at 0800  . tadalafil (CIALIS) 5 MG tablet Take 5 mg by mouth as needed.    Past Week at  Unknown time  . valACYclovir (VALTREX) 1000 MG tablet Take 500 mg by mouth every morning.    04/18/2019 at 0800  . pregabalin (LYRICA) 150 MG capsule Take 150 mg by mouth at bedtime.   Unknown at Unknown time   Allergies: No Known Allergies  Family History  Problem Relation Age of Onset  . Heart disease Mother   . Stroke Mother   . Heart disease Father   . Hypertension Father   . Diabetes Father   . Heart disease Sister   . Lupus Sister   . Heart disease Brother   . Hypertension Brother   . Diabetes Brother   . Heart disease Maternal Aunt   . Heart disease Maternal Uncle   . Cancer Maternal Uncle        prostate  . Cancer Paternal Uncle        prostate  . Colon cancer Neg Hx   . Stomach cancer Neg Hx    Social History:  reports that he has never smoked. He has  never used smokeless tobacco. He reports current alcohol use of about 8.0 standard drinks of alcohol per week. He reports that he does not use drugs.  ROS: A complete review of systems was performed.  All systems are negative except for pertinent findings as noted. Review of Systems  All other systems reviewed and are negative.    Physical Exam:  Vital signs in last 24 hours: Temp:  [97.8 F (36.6 C)] 97.8 F (36.6 C) (09/25 1155) Pulse Rate:  [84] 84 (09/25 1155) Resp:  [16] 16 (09/25 1155) BP: (113)/(76) 113/76 (09/25 1155) SpO2:  [100 %] 100 % (09/25 1155) Weight:  [100.4 kg] 100.4 kg (09/25 1155) General:  Alert and oriented, No acute distress HEENT: Normocephalic, atraumatic Cardiovascular: Regular rate and rhythm Lungs: Regular rate and effort Abdomen: Soft, nontender, nondistended, no abdominal masses Back: No CVA tenderness Extremities: No edema Neurologic: Grossly intact  Laboratory Data:  Results for orders placed or performed during the hospital encounter of 04/18/19 (from the past 24 hour(s))  I-STAT, chem 8     Status: Abnormal   Collection Time: 04/18/19 12:18 PM  Result Value Ref Range   Sodium 142 135 - 145 mmol/L   Potassium 3.9 3.5 - 5.1 mmol/L   Chloride 108 98 - 111 mmol/L   BUN 15 8 - 23 mg/dL   Creatinine, Ser 0.90 0.61 - 1.24 mg/dL   Glucose, Bld 95 70 - 99 mg/dL   Calcium, Ion 1.23 1.15 - 1.40 mmol/L   TCO2 20 (L) 22 - 32 mmol/L   Hemoglobin 13.6 13.0 - 17.0 g/dL   HCT 40.0 39.0 - 52.0 %   Recent Results (from the past 240 hour(s))  Novel Coronavirus, NAA (Hosp order, Send-out to Ref Lab; TAT 18-24 hrs     Status: None   Collection Time: 04/15/19  8:43 AM   Specimen: Nasopharyngeal Swab; Respiratory  Result Value Ref Range Status   SARS-CoV-2, NAA NOT DETECTED NOT DETECTED Final    Comment: (NOTE) This nucleic acid amplification test was developed and its performance characteristics determined by [employer]. Nucleic acid  amplification tests include PCR and TMA. This test has not been FDA cleared or approved. This test has been authorized by FDA under an Emergency Use Authorization (EUA). This test is only authorized for the duration of time the declaration that circumstances exist justifying the authorization of the emergency use of in vitro diagnostic tests for detection of SARS-CoV-2 virus and/or  diagnosis of COVID-19 infection under section 564(b)(1) of the Act, 21 U.S.C. [id number]) (1), unless the authorization is terminated or revoked sooner. When diagnostic testing is negative, the possibility of a false negative result should be considered in the context of a patient's recent exposures and the presence of clinical signs and symptoms consistent with COVID-19. An individual without symptoms of COVID- 19 and who is not shedding SARS-CoV-2 vi rus would expect to have a negative (not detected) result in this assay. Performed At: [facility] [address] [place], [place] [id number] Rush Farmer MD [id number]    [place]  Final    Comment: Performed at [facility], [place] [address], [place], [place] [address]   Creatinine: Recent Labs    04/18/19 1218  CREATININE 0.90    Impression/Assessment:  BPH, prostate cyst -   Plan:  I discussed with the patient the nature, potential benefits, risks and alternatives to thulium laser vaporization prostate, including side effects of the proposed treatment, the likelihood of the patient achieving the goals of the procedure, and any potential problems that might occur during the procedure or recuperation. We discussed flow symptoms typically improve, however they may remain the same or rarely worsen.  We also discussed risk of stricture, incontinence and bleeding among others.  We also discussed risk of RGE and post-op foley care. All questions answered. Patient elects to proceed.  Hopefully he will be able to stop  his alpha-blocker.   Festus Aloe 04/18/2019, 12:47 PM

## 2019-04-18 NOTE — Op Note (Signed)
Preoperative diagnosis: BPH, lower urinary tract symptoms, prostatic urethral cyst Postoperative diagnosis: Same  Procedure: Cystoscopy with thulium laser incision of the prostate  Surgeon: Junious Silk  Anesthesia: General  Indication for procedure: Robert Carroll is a 63 year old male with a weak stream who is done well with alpha blockers.  On office cystoscopy he had a peculiar cyst on the anterior proximal prostatic urethra at the bladder neck which looked to be causing some visual obstruction when viewed with the flexible cystoscope.  He had a very high and tight bladder neck which was difficult to get over.  He was brought in today for the above procedure.  Findings: The urethra was unremarkable, prostate as above and I was able to vaporize the cyst and it drained some yellow thick secretion.  The bladder was moderately trabeculated with no stone or foreign body.  No urothelial lesions.  The ureteral orifice ease were normal with clear reflux.  On digital rectal exam the penis was circumcised and without mass or lesion.  Testicles descended bilaterally and palpably normal.  Prostate was about 30 g and smooth without hard area or nodule.  I left a B&O suppository.  Description of procedure: After consent was obtained patient brought to the operating room.  After adequate anesthesia he was placed in lithotomy position and prepped and draped in the usual sterile fashion.  A timeout was performed to confirm the patient and procedure.  The cystoscope with the continuous flow laser sheath was passed per urethra and the bladder inspected.  I then took 1000 m laser fiber and vaporized up under the cyst.  It drained some fluid and then it was vaporized in its entirety.  It was smooth mucosa and benign appearing.  I then made an incision in the prostate at 5:00 in line with the left ureteral orifice and brought this down to the verumontanum in a similar fashion an incision was made on the right at 7:00 in line  with the right ureteral orifice down through the bladder neck to the verumontanum.  This nicely opened up the prostatic urethra.  He did still have some median lobe and bladder neck tissue but it seemed to me to drop down and create a good passage.  Therefore I left the posterior median lobe/tissue completely in place as shown above the lateral lobe tissue was resected as part of the incision but most of this left in place as well.  This created a nice channel and hemostasis was excellent.  The scope was removed and an 45 [place] coud catheter placed in left to gravity drainage.  Drainage was clear.  He was awakened and taken to the recovery room in stable condition.  Complications: None  Blood loss: Minimal  Specimens: None  Drains: 87 French coud catheter  Disposition: Patient stable to PACU

## 2019-04-18 NOTE — Anesthesia Preprocedure Evaluation (Signed)
Anesthesia Evaluation  Patient identified by MRN, date of birth, ID band Patient awake    Reviewed: Allergy & Precautions, NPO status , Patient's Chart, lab work & pertinent test results  History of Anesthesia Complications Negative for: history of anesthetic complications  Airway Mallampati: II  TM Distance: >3 FB Neck ROM: Full    Dental  (+) Dental Advisory Given, Teeth Intact   Pulmonary neg shortness of breath, sleep apnea and Continuous Positive Airway Pressure Ventilation , neg COPD, neg recent URI,    breath sounds clear to auscultation       Cardiovascular hypertension, Pt. on medications (-) angina(-) Past MI and (-) CHF (-) dysrhythmias  Rhythm:Regular     Neuro/Psych negative neurological ROS  negative psych ROS   GI/Hepatic Neg liver ROS, GERD  Medicated and Controlled,  Endo/Other  negative endocrine ROS  Renal/GU negative Renal ROS     Musculoskeletal   Abdominal   Peds  Hematology negative hematology ROS (+)   Anesthesia Other Findings   Reproductive/Obstetrics                             Anesthesia Physical Anesthesia Plan  ASA: II  Anesthesia Plan: General   Post-op Pain Management:    Induction: Intravenous  PONV Risk Score and Plan: 2 and Ondansetron and Dexamethasone  Airway Management Planned: LMA  Additional Equipment: None  Intra-op Plan:   Post-operative Plan: Extubation in OR  Informed Consent: I have reviewed the patients History and Physical, chart, labs and discussed the procedure including the risks, benefits and alternatives for the proposed anesthesia with the patient or authorized representative who has indicated his/her understanding and acceptance.     Dental advisory given  Plan Discussed with: CRNA and Surgeon  Anesthesia Plan Comments:         Anesthesia Quick Evaluation

## 2019-04-18 NOTE — Transfer of Care (Signed)
Immediate Anesthesia Transfer of Care Note  Patient: Robert Carroll  Procedure(s) Performed: Procedure(s) (LRB): THULIUM LASER TURP (TRANSURETHRAL RESECTION OF PROSTATE); TRANSURETHRAL INCISION OF THE PROSTATE (N/A)  Patient Location: PACU  Anesthesia Type: General  Level of Consciousness: awake, sedated, patient cooperative and responds to stimulation  Airway & Oxygen Therapy: Patient Spontanous Breathing and Patient connected to [place] O2 and soft FM  Post-op Assessment: Report given to PACU RN, Post -op Vital signs reviewed and stable and Patient moving all extremities  Post vital signs: Reviewed and stable  Complications: No apparent anesthesia complications

## 2019-04-19 NOTE — Anesthesia Postprocedure Evaluation (Signed)
Anesthesia Post Note  Patient: Robert Carroll  Procedure(s) Performed: THULIUM LASER TURP (TRANSURETHRAL RESECTION OF PROSTATE); TRANSURETHRAL INCISION OF THE PROSTATE (N/A Prostate)     Patient location during evaluation: PACU Anesthesia Type: General Level of consciousness: awake and alert Pain management: pain level controlled Vital Signs Assessment: post-procedure vital signs reviewed and stable Respiratory status: spontaneous breathing, nonlabored ventilation, respiratory function stable and patient connected to nasal cannula oxygen Cardiovascular status: blood pressure returned to baseline and stable Postop Assessment: no apparent nausea or vomiting Anesthetic complications: no    Last Vitals:  Vitals:   04/18/19 1500 04/18/19 1533  BP: 120/74 113/79  Pulse: (!) 52 (!) 53  Resp: 13 16  Temp:  36.6 C  SpO2: 95% 96%    Last Pain:  Vitals:   04/18/19 1533  TempSrc:   PainSc: 0-No pain                 Castor Gittleman

## 2019-04-21 ENCOUNTER — Encounter (HOSPITAL_BASED_OUTPATIENT_CLINIC_OR_DEPARTMENT_OTHER): Payer: Self-pay | Admitting: Urology

## 2019-10-27 NOTE — Patient Instructions (Addendum)
DUE TO COVID-19 ONLY TWO VISITORS ARE ALLOWED TO COME WITH YOU AND STAY IN THE WAITING ROOM ONLY DURING PRE OP AND PROCEDURE. THE TWO VISITORS MAY VISIT WITH YOU IN YOUR PRIVATE ROOM DURING VISITING HOURS ONLY!!   COVID SWAB TESTING MUST BE COMPLETED ON:  Monday, November 03, 2019 [facility] [place] (Must self quarantine after testing. Follow instructions on handout.)         Your procedure is scheduled on:  Thursday, November 06, 2019   Report to [facility] Main  Entrance    Report to admitting at 8:25 AM   Call this number if you have problems the morning of surgery [phone]   Bring CPAP mask and tubing day of surgery   Do not eat food :After Midnight.   May have liquids until 7:55 AM day of surgery   CLEAR LIQUID DIET  Foods Allowed                                                                     Foods Excluded  Water, Black Coffee and tea, regular and decaf                             liquids that you cannot  Plain Jell-O in any flavor  (No red)                                           see through such as: Fruit ices (not with fruit pulp)                                     milk, soups, orange juice  Iced Popsicles (No red)                                    All solid food Carbonated beverages, regular and diet                                    Apple juices Sports drinks like Gatorade (No red) Lightly seasoned clear broth or consume(fat free) Sugar, honey syrup  Sample Menu Breakfast                                Lunch                                     Supper Cranberry juice                    Beef broth  Chicken broth Jell-O                                     Grape juice                           Apple juice Coffee or tea                        Jell-O                                      Popsicle                                                 Coffee or tea                        Coffee or tea   Complete one Ensure drink the morning of surgery at  7:55 AM     the day of surgery.    Oral Hygiene is also important to reduce your risk of infection.                                    Remember - BRUSH YOUR TEETH THE MORNING OF SURGERY WITH YOUR REGULAR TOOTHPASTE   Do NOT smoke after Midnight   Take these medicines the morning of surgery with A SIP OF WATER: Doxazosin, Gabapentin, Valacyclovir   May use Flonase if needed   Do NOT take Cialis 24 hours prior to surgery                               You may not have any metal on your body including jewelry, and body piercings             Do not wear lotions, powders, perfumes/cologne, or deodorant                           Men may shave face and neck.   Do not bring valuables to the hospital. [place].   Contacts, dentures or bridgework may not be worn into surgery.    Patients discharged the day of surgery will not be allowed to drive home.   Special Instructions: Bring a copy of your healthcare power of attorney and living will documents         the day of surgery if you haven't scanned them in before.              Please read over the following fact sheets you were given:  [facility] - Preparing for Surgery Before surgery, you can play an important role.  Because skin is not sterile, your skin needs to be as free of germs as possible.  You can reduce the number of germs on your skin by washing with CHG (chlorahexidine gluconate) soap  before surgery.  CHG is an antiseptic cleaner which kills germs and bonds with the skin to continue killing germs even after washing. Please DO NOT use if you have an allergy to CHG or antibacterial soaps.  If your skin becomes reddened/irritated stop using the CHG and inform your nurse when you arrive at Short Stay. Do not shave (including legs and underarms) for at least 48 hours prior to the  first CHG shower.  You may shave your face/neck.  Please follow these instructions carefully:  1.  Shower with CHG Soap the night before surgery and the  morning of surgery.  2.  If you choose to wash your hair, wash your hair first as usual with your normal  shampoo.  3.  After you shampoo, rinse your hair and body thoroughly to remove the shampoo.                             4.  Use CHG as you would any other liquid soap.  You can apply chg directly to the skin and wash.  Gently with a scrungie or clean washcloth.  5.  Apply the CHG Soap to your body ONLY FROM THE NECK DOWN.   Do   not use on face/ open                           Wound or open sores. Avoid contact with eyes, ears mouth and   genitals (private parts).                       Wash face,  Genitals (private parts) with your normal soap.             6.  Wash thoroughly, paying special attention to the area where your    surgery  will be performed.  7.  Thoroughly rinse your body with warm water from the neck down.  8.  DO NOT shower/wash with your normal soap after using and rinsing off the CHG Soap.                9.  Pat yourself dry with a clean towel.            10.  Wear clean pajamas.            11.  Place clean sheets on your bed the night of your first shower and do not  sleep with pets. Day of Surgery : Do not apply any lotions/deodorants the morning of surgery.  Please wear clean clothes to the hospital/surgery center.  FAILURE TO FOLLOW THESE INSTRUCTIONS MAY RESULT IN THE CANCELLATION OF YOUR SURGERY  PATIENT SIGNATURE_________________________________  NURSE SIGNATURE__________________________________  ________________________________________________________________________   Robert Carroll  An incentive spirometer is a tool that can help keep your lungs clear and active. This tool measures how well you are filling your lungs with each breath. Taking long deep breaths may help reverse or decrease the chance  of developing breathing (pulmonary) problems (especially infection) following:  A long period of time when you are unable to move or be active. BEFORE THE PROCEDURE   If the spirometer includes an indicator to show your best effort, your nurse or respiratory therapist will set it to a desired goal.  If possible, sit up straight or lean slightly forward. Try not to slouch.  Hold the incentive spirometer in an upright position. INSTRUCTIONS FOR  USE  1. Sit on the edge of your bed if possible, or sit up as far as you can in bed or on a chair. 2. Hold the incentive spirometer in an upright position. 3. Breathe out normally. 4. Place the mouthpiece in your mouth and seal your lips tightly around it. 5. Breathe in slowly and as deeply as possible, raising the piston or the ball toward the top of the column. 6. Hold your breath for 3-5 seconds or for as long as possible. Allow the piston or ball to fall to the bottom of the column. 7. Remove the mouthpiece from your mouth and breathe out normally. 8. Rest for a few seconds and repeat Steps 1 through 7 at least 10 times every 1-2 hours when you are awake. Take your time and take a few normal breaths between deep breaths. 9. The spirometer may include an indicator to show your best effort. Use the indicator as a goal to work toward during each repetition. 10. After each set of 10 deep breaths, practice coughing to be sure your lungs are clear. If you have an incision (the cut made at the time of surgery), support your incision when coughing by placing a pillow or rolled up towels firmly against it. Once you are able to get out of bed, walk around indoors and cough well. You may stop using the incentive spirometer when instructed by your caregiver.  RISKS AND COMPLICATIONS  Take your time so you do not get dizzy or light-headed.  If you are in pain, you may need to take or ask for pain medication before doing incentive spirometry. It is harder to  take a deep breath if you are having pain. AFTER USE  Rest and breathe slowly and easily.  It can be helpful to keep track of a log of your progress. Your caregiver can provide you with a simple table to help with this. If you are using the spirometer at home, follow these instructions: [place] IF:   You are having difficultly using the spirometer.  You have trouble using the spirometer as often as instructed.  Your pain medication is not giving enough relief while using the spirometer.  You develop fever of 100.5 F (38.1 C) or higher. SEEK IMMEDIATE MEDICAL CARE IF:   You cough up bloody sputum that had not been present before.  You develop fever of 102 F (38.9 C) or greater.  You develop worsening pain at or near the incision site. MAKE SURE YOU:   Understand these instructions.  Will watch your condition.  Will get help right away if you are not doing well or get worse. Document Released: 11/20/2006 Document Revised: 10/02/2011 Document Reviewed: 01/21/2007 Exit[employer] Patient Information 2014 Exit[employer], [place].   ________________________________________________________________________  WHAT IS A BLOOD TRANSFUSION? Blood Transfusion Information  A transfusion is the replacement of blood or some of its parts. Blood is made up of multiple cells which provide different functions.  Red blood cells carry oxygen and are used for blood loss replacement.  White blood cells fight against infection.  Platelets control bleeding.  Plasma helps clot blood.  Other blood products are available for specialized needs, such as hemophilia or other clotting disorders. BEFORE THE TRANSFUSION  Who gives blood for transfusions?   Healthy volunteers who are fully evaluated to make sure their blood is safe. This is blood bank blood. Transfusion therapy is the safest it has ever been in the practice of medicine. Before blood is taken from  a donor, a complete history is taken to  make sure that person has no history of diseases nor engages in risky social behavior (examples are intravenous drug use or sexual activity with multiple partners). The donor's travel history is screened to minimize risk of transmitting infections, such as malaria. The donated blood is tested for signs of infectious diseases, such as HIV and hepatitis. The blood is then tested to be sure it is compatible with you in order to minimize the chance of a transfusion reaction. If you or a relative donates blood, this is often done in anticipation of surgery and is not appropriate for emergency situations. It takes many days to process the donated blood. RISKS AND COMPLICATIONS Although transfusion therapy is very safe and saves many lives, the main dangers of transfusion include:   Getting an infectious disease.  Developing a transfusion reaction. This is an allergic reaction to something in the blood you were given. Every precaution is taken to prevent this. The decision to have a blood transfusion has been considered carefully by your caregiver before blood is given. Blood is not given unless the benefits outweigh the risks. AFTER THE TRANSFUSION  Right after receiving a blood transfusion, you will usually feel much better and more energetic. This is especially true if your red blood cells have gotten low (anemic). The transfusion raises the level of the red blood cells which carry oxygen, and this usually causes an energy increase.  The nurse administering the transfusion will monitor you carefully for complications. HOME CARE INSTRUCTIONS  No special instructions are needed after a transfusion. You may find your energy is better. Speak with your caregiver about any limitations on activity for underlying diseases you may have. SEEK MEDICAL CARE IF:   Your condition is not improving after your transfusion.  You develop redness or irritation at the intravenous (IV) site. SEEK IMMEDIATE MEDICAL CARE  IF:  Any of the following symptoms occur over the next 12 hours:  Shaking chills.  You have a temperature by mouth above 102 F (38.9 C), not controlled by medicine.  Chest, back, or muscle pain.  People around you feel you are not acting correctly or are confused.  Shortness of breath or difficulty breathing.  Dizziness and fainting.  You get a rash or develop hives.  You have a decrease in urine output.  Your urine turns a dark color or changes to pink, red, or brown. Any of the following symptoms occur over the next 10 days:  You have a temperature by mouth above 102 F (38.9 C), not controlled by medicine.  Shortness of breath.  Weakness after normal activity.  The white part of the eye turns yellow (jaundice).  You have a decrease in the amount of urine or are urinating less often.  Your urine turns a dark color or changes to pink, red, or brown. Document Released: 07/07/2000 Document Revised: 10/02/2011 Document Reviewed: 02/24/2008 [facility] Patient Information 2014 [place], [place].  _______________________________________________________________________

## 2019-10-28 ENCOUNTER — Encounter (HOSPITAL_COMMUNITY): Payer: Self-pay

## 2019-10-28 ENCOUNTER — Other Ambulatory Visit: Payer: Self-pay

## 2019-10-28 ENCOUNTER — Encounter (HOSPITAL_COMMUNITY)
Admission: RE | Admit: 2019-10-28 | Discharge: 2019-10-28 | Disposition: A | Discharge status: Home / Self Care | Payer: 59 | Source: Ambulatory Visit | Attending: Orthopedic Surgery | Admitting: Orthopedic Surgery

## 2019-10-28 DIAGNOSIS — I10 Essential (primary) hypertension: Secondary | ICD-10-CM | POA: Insufficient documentation

## 2019-10-28 DIAGNOSIS — Z01818 Encounter for other preprocedural examination: Secondary | ICD-10-CM | POA: Insufficient documentation

## 2019-10-28 HISTORY — DX: Personal history of other diseases of the nervous system and sense organs: Z86.69

## 2019-10-28 HISTORY — DX: Male erectile dysfunction, unspecified: N52.9

## 2019-10-28 NOTE — Progress Notes (Signed)
COVID vaccine series complete  PCP - Dr. Iline Oven last office visit 10/10/2019 Cardiologist - N/A  Chest x-ray -  Greater than 2 years EKG - 10/29/19 in epic Stress Test -  N/A ECHO -  N/A Cardiac Cath -  N/A  Sleep Study - Yes CPAP - Yes  Fasting Blood Sugar -  N/A Checks Blood Sugar __ N/A___ times a day  Blood Thinner Instructions:  N/A Aspirin Instructions: Yes Last Dose: 10/30/2019  Anesthesia review:  N/A  Patient denies shortness of breath, fever, cough and chest pain at PAT appointment   Patient verbalized understanding of instructions that were given to them at the PAT appointment. Patient was also instructed that they will need to review over the PAT instructions again at home before surgery.

## 2019-10-28 NOTE — H&P (Signed)
Robert Carroll is an 64 y.o. male.    Chief Complaint:   Left hip pain s/p THA  Procedure:  Left THA open scar debridement  HPI: Pt is a 63 y.o. male complaining of left hip pain pain for 1 year. Pain had continually increased since the beginning. X-rays in the clinic show previous THA on the left side.   Pt has tried various conservative treatments which have failed to alleviate their symptoms, including NSAIDs/analgesics, formal PT, cortisone injection, assistance devices and activity modifications. Various options are discussed with the patient. Risks, benefits and expectations were discussed with the patient. Patient understand the risks, benefits and expectations and wishes to proceed with surgery.    PCP: Antony Contras, MD  D/C Plans:       Home   (same day)  Post-op Meds:       No Rx given  Tranexamic Acid:      To be given - IV   Decadron:      Is to be given  FYI:      ASA  [place]  DME:   Pt already has equipment   PT:   HEP  Pharmacy: [facility] [place].   PMH: Past Medical History:  Diagnosis Date  . BPH (benign prostatic hyperplasia)   . Colon polyp    adenomatous, 2008  . Erectile dysfunction   . Genital herpes   . GERD (gastroesophageal reflux disease)   . History of partial seizures    starts with shoulder numbness and goes to fingers  . Hyperlipidemia   . Hypertension   . OA (osteoarthritis)   . OSA on CPAP     PSH: Past Surgical History:  Procedure Laterality Date  . BUNIONECTOMY Bilateral   . COLONOSCOPY   2013  . EYE SURGERY     LASIK 2 YEARS AGO   . [place]   right  . THULIUM LASER TURP (TRANSURETHRAL RESECTION OF PROSTATE) N/A 04/18/2019   Procedure: [name] LASER TURP (TRANSURETHRAL RESECTION OF PROSTATE); TRANSURETHRAL INCISION OF THE PROSTATE;  Surgeon: Festus Aloe, MD;  Location: [facility];  Service: Urology;  Laterality: N/A;  . TOTAL HIP ARTHROPLASTY Right 12/26/2016   Procedure: RIGHT  TOTAL HIP ARTHROPLASTY ANTERIOR APPROACH;  Surgeon: Paralee Cancel, MD;  Location: [facility];  Service: Orthopedics;  Laterality: Right;  . TOTAL HIP ARTHROPLASTY Left 09/17/2018   Procedure: TOTAL HIP ARTHROPLASTY ANTERIOR APPROACH;  Surgeon: Paralee Cancel, MD;  Location: [facility];  Service: Orthopedics;  Laterality: Left;  70 minutes    Social History:  reports that he has never smoked. He has never used smokeless tobacco. He reports current alcohol use of about 8.0 standard drinks of alcohol per week. He reports that he does not use drugs.  Allergies:  No Known Allergies  Medications: No current facility-administered medications for this encounter.   Current Outpatient Medications  Medication Sig Dispense Refill  . acetaminophen (TYLENOL) 500 MG tablet Take 500-1,000 mg by mouth every 6 (six) hours as needed for moderate pain or headache.    Marland Kitchen amLODipine-benazepril (LOTREL) 10-20 MG capsule Take 1 capsule by mouth at bedtime.     Marland Kitchen aspirin EC 81 MG tablet Take 81 mg by mouth daily in the afternoon.    Marland Kitchen atorvastatin (LIPITOR) 20 MG tablet Take 20 mg by mouth every evening.     . Brimonidine Tartrate (LUMIFY) 0.025 % SOLN Place 1 drop into both eyes daily.    Marland Kitchen  diclofenac (VOLTAREN) 75 MG EC tablet Take 75 mg by mouth 2 (two) times daily.    . diclofenac Sodium (VOLTAREN) 1 % GEL Apply 1 application topically 4 (four) times daily as needed (pain).    Marland Kitchen diphenhydrAMINE (BENADRYL) 25 MG tablet Take 25 mg by mouth daily.    Marland Kitchen doxazosin (CARDURA) 4 MG tablet Take 4 mg by mouth every morning.     . fluticasone (FLONASE) 50 MCG/ACT nasal spray Place 2 sprays into both nostrils as needed for allergies or rhinitis.     Marland Kitchen gabapentin (NEURONTIN) 300 MG capsule TAKE 3 CAPSULES BY MOUTH 3  TIMES DAILY (Patient taking differently: Take 600 mg by mouth 3 (three) times daily. ) 810 capsule 0  . latanoprost (XALATAN) 0.005 % ophthalmic solution Place 1 drop into both eyes at bedtime.    . Melatonin 3 MG CAPS  Take 3-6 mg by mouth at bedtime as needed (sleep).    . Multiple Vitamin (MULTIVITAMIN WITH MINERALS) TABS tablet Take 1 tablet by mouth daily.    . Omega-3 Fatty Acids (FISH OIL) 1000 MG CAPS Take 1,000 mg by mouth daily.     Marland Kitchen omeprazole (PRILOSEC) 20 MG capsule Take 20 mg by mouth daily before supper.     Vladimir Faster Glycol-Propyl Glycol (SYSTANE OP) Place 1 drop into both eyes daily as needed (dry eyes).    . tadalafil (CIALIS) 5 MG tablet Take 5 mg by mouth daily as needed for erectile dysfunction.     . valACYclovir (VALTREX) 1000 MG tablet Take 500 mg by mouth every morning.      Facility-Administered Medications Ordered in Other Encounters  Medication Dose Route Frequency Provider Last Rate Last Admin  . gadopentetate dimeglumine (MAGNEVIST) injection 20 mL  20 mL Intravenous Once PRN Penumalli, Earlean Polka, MD           Review of Systems  Constitutional: Negative.   HENT: Negative.   Eyes: Negative.   Respiratory: Negative.   Cardiovascular: Negative.   Gastrointestinal: Positive for heartburn.  Genitourinary: Negative.   Musculoskeletal: Positive for joint pain.  Skin: Negative.   Neurological: Negative.   Endo/Heme/Allergies: Negative.   Psychiatric/Behavioral: Negative.        Physical Exam  Constitutional: He is oriented to person, place, and time. He appears well-developed.  HENT:  Head: Normocephalic.  Eyes: Pupils are equal, round, and reactive to light.  Neck: No JVD present. No tracheal deviation present. No thyromegaly present.  Cardiovascular: Normal rate, regular rhythm and intact distal pulses.  Respiratory: Effort normal and breath sounds normal. No respiratory distress. He has no wheezes.  GI: Soft. There is no abdominal tenderness. There is no guarding.  Musculoskeletal:     Cervical back: Neck supple.     Left hip: Tenderness present. Decreased range of motion. Decreased strength.  Lymphadenopathy:    He has no cervical adenopathy.  Neurological:  He is alert and oriented to person, place, and time.  Skin: Skin is warm and dry.  Psychiatric: He has a normal mood and affect.       Assessment/Plan Assessment:    Left hip pain s/p THA  Plan: Patient will undergo a left THA open scar debridement  on 11/06/2019 per Dr. Alvan Dame at [facility]. Risks benefits and expectations were discussed with the patient. Patient understand risks, benefits and expectations and wishes to proceed.   [place] Tayra Dawe   PA-C  10/28/2019, 9:11 PM

## 2019-10-29 ENCOUNTER — Encounter (HOSPITAL_COMMUNITY)
Admission: RE | Admit: 2019-10-29 | Discharge: 2019-10-29 | Disposition: A | Discharge status: Home / Self Care | Payer: 59 | Source: Ambulatory Visit | Attending: Orthopedic Surgery | Admitting: Orthopedic Surgery

## 2019-10-29 DIAGNOSIS — I10 Essential (primary) hypertension: Secondary | ICD-10-CM | POA: Diagnosis not present

## 2019-10-29 DIAGNOSIS — Z01818 Encounter for other preprocedural examination: Secondary | ICD-10-CM | POA: Diagnosis not present

## 2019-10-29 LAB — CBC
HCT: 40.5 % (ref 39.0–52.0)
Hemoglobin: 13.4 g/dL (ref 13.0–17.0)
MCH: 31.4 pg (ref 26.0–34.0)
MCHC: 33.1 g/dL (ref 30.0–36.0)
MCV: 94.8 fL (ref 80.0–100.0)
Platelets: 194 10*3/uL (ref 150–400)
RBC: 4.27 MIL/uL (ref 4.22–5.81)
RDW: 12.5 % (ref 11.5–15.5)
WBC: 2.5 10*3/uL — ABNORMAL LOW (ref 4.0–10.5)
nRBC: 0 % (ref 0.0–0.2)

## 2019-10-29 LAB — BASIC METABOLIC PANEL
Anion gap: 7 (ref 5–15)
BUN: 15 mg/dL (ref 8–23)
CO2: 25 mmol/L (ref 22–32)
Calcium: 9.1 mg/dL (ref 8.9–10.3)
Chloride: 112 mmol/L — ABNORMAL HIGH (ref 98–111)
Creatinine, Ser: 0.83 mg/dL (ref 0.61–1.24)
GFR calc Af Amer: >60 mL/min (ref >60)
GFR calc non Af Amer: >60 mL/min (ref >60)
Glucose, Bld: 94 mg/dL (ref 70–99)
Potassium: 3.8 mmol/L (ref 3.5–5.1)
Sodium: 144 mmol/L (ref 135–145)

## 2019-10-29 LAB — SURGICAL PCR SCREEN
MRSA, PCR: NEGATIVE
Staphylococcus aureus: NEGATIVE

## 2019-11-03 ENCOUNTER — Other Ambulatory Visit (HOSPITAL_COMMUNITY)
Admission: RE | Admit: 2019-11-03 | Discharge: 2019-11-03 | Disposition: A | Discharge status: Home / Self Care | Payer: 59 | Source: Ambulatory Visit | Attending: Orthopedic Surgery | Admitting: Orthopedic Surgery

## 2019-11-03 DIAGNOSIS — Z20822 Contact with and (suspected) exposure to covid-19: Secondary | ICD-10-CM | POA: Insufficient documentation

## 2019-11-03 DIAGNOSIS — Z01812 Encounter for preprocedural laboratory examination: Secondary | ICD-10-CM | POA: Insufficient documentation

## 2019-11-03 LAB — SARS CORONAVIRUS 2 (TAT 6-24 HRS): SARS Coronavirus 2: NEGATIVE

## 2019-11-06 ENCOUNTER — Ambulatory Visit (HOSPITAL_COMMUNITY): Payer: 59 | Admitting: Anesthesiology

## 2019-11-06 ENCOUNTER — Ambulatory Visit (HOSPITAL_COMMUNITY)
Admission: RE | Admit: 2019-11-06 | Discharge: 2019-11-06 | Disposition: A | Discharge status: Home / Self Care | Payer: 59 | Source: Ambulatory Visit | Attending: Orthopedic Surgery | Admitting: Orthopedic Surgery

## 2019-11-06 ENCOUNTER — Encounter (HOSPITAL_COMMUNITY): Payer: Self-pay | Admitting: Orthopedic Surgery

## 2019-11-06 ENCOUNTER — Encounter (HOSPITAL_COMMUNITY)
Admission: RE | Disposition: A | Discharge status: Home / Self Care | Payer: Self-pay | Source: Ambulatory Visit | Attending: Orthopedic Surgery

## 2019-11-06 DIAGNOSIS — Z86010 Personal history of colonic polyps: Secondary | ICD-10-CM | POA: Diagnosis not present

## 2019-11-06 DIAGNOSIS — R569 Unspecified convulsions: Secondary | ICD-10-CM | POA: Insufficient documentation

## 2019-11-06 DIAGNOSIS — Y9389 Activity, other specified: Secondary | ICD-10-CM | POA: Diagnosis not present

## 2019-11-06 DIAGNOSIS — K219 Gastro-esophageal reflux disease without esophagitis: Secondary | ICD-10-CM | POA: Insufficient documentation

## 2019-11-06 DIAGNOSIS — Z791 Long term (current) use of non-steroidal anti-inflammatories (NSAID): Secondary | ICD-10-CM | POA: Insufficient documentation

## 2019-11-06 DIAGNOSIS — N400 Enlarged prostate without lower urinary tract symptoms: Secondary | ICD-10-CM | POA: Insufficient documentation

## 2019-11-06 DIAGNOSIS — X58XXXA Exposure to other specified factors, initial encounter: Secondary | ICD-10-CM | POA: Diagnosis not present

## 2019-11-06 DIAGNOSIS — I10 Essential (primary) hypertension: Secondary | ICD-10-CM | POA: Insufficient documentation

## 2019-11-06 DIAGNOSIS — M1990 Unspecified osteoarthritis, unspecified site: Secondary | ICD-10-CM | POA: Diagnosis not present

## 2019-11-06 DIAGNOSIS — G4733 Obstructive sleep apnea (adult) (pediatric): Secondary | ICD-10-CM | POA: Diagnosis not present

## 2019-11-06 DIAGNOSIS — Z96653 Presence of artificial knee joint, bilateral: Secondary | ICD-10-CM | POA: Insufficient documentation

## 2019-11-06 DIAGNOSIS — Z79899 Other long term (current) drug therapy: Secondary | ICD-10-CM | POA: Insufficient documentation

## 2019-11-06 DIAGNOSIS — E785 Hyperlipidemia, unspecified: Secondary | ICD-10-CM | POA: Insufficient documentation

## 2019-11-06 DIAGNOSIS — G4730 Sleep apnea, unspecified: Secondary | ICD-10-CM | POA: Diagnosis not present

## 2019-11-06 DIAGNOSIS — M24652 Ankylosis, left hip: Secondary | ICD-10-CM | POA: Diagnosis present

## 2019-11-06 DIAGNOSIS — T8484XA Pain due to internal orthopedic prosthetic devices, implants and grafts, initial encounter: Secondary | ICD-10-CM | POA: Insufficient documentation

## 2019-11-06 DIAGNOSIS — Z7982 Long term (current) use of aspirin: Secondary | ICD-10-CM | POA: Diagnosis not present

## 2019-11-06 LAB — TYPE AND SCREEN
ABO/RH(D): A POS
Antibody Screen: NEGATIVE

## 2019-11-06 SURGERY — IRRIGATION AND DEBRIDEMENT ANTERIOR HIP
Anesthesia: Spinal | Laterality: Left

## 2019-11-06 MED ORDER — PROPOFOL 1000 MG/100ML IV EMUL
INTRAVENOUS | Status: AC
  Filled 2019-11-06: qty 100

## 2019-11-06 MED ORDER — POLYETHYLENE GLYCOL 3350 17 G PO PACK: 17.0000 g | PACK | Freq: Two times a day (BID) | ORAL | 0 refills | Status: DC

## 2019-11-06 MED ORDER — MIDAZOLAM HCL 2 MG/2ML IJ SOLN
INTRAMUSCULAR | Status: DC | PRN
  Administered 2019-11-06: 2 mg via INTRAVENOUS

## 2019-11-06 MED ORDER — FERROUS SULFATE 325 (65 FE) MG PO TABS: 325.0000 mg | ORAL_TABLET | Freq: Three times a day (TID) | ORAL | 0 refills | Status: DC

## 2019-11-06 MED ORDER — ALBUMIN HUMAN 5 % IV SOLN: INTRAVENOUS | Status: DC | PRN

## 2019-11-06 MED ORDER — HYDROCODONE-ACETAMINOPHEN 5-325 MG PO TABS
ORAL_TABLET | ORAL | Status: AC
  Filled 2019-11-06: qty 1

## 2019-11-06 MED ORDER — ONDANSETRON HCL 4 MG/2ML IJ SOLN
INTRAMUSCULAR | Status: DC | PRN
  Administered 2019-11-06: 4 mg via INTRAVENOUS

## 2019-11-06 MED ORDER — PROPOFOL 1000 MG/100ML IV EMUL
INTRAVENOUS | Status: AC
  Filled 2019-11-06: qty 200

## 2019-11-06 MED ORDER — PHENYLEPHRINE HCL (PRESSORS) 10 MG/ML IV SOLN
INTRAVENOUS | Status: AC
  Filled 2019-11-06: qty 1

## 2019-11-06 MED ORDER — TRANEXAMIC ACID-NACL 1000-0.7 MG/100ML-% IV SOLN
1000.0000 mg | INTRAVENOUS | Status: AC
  Administered 2019-11-06: 11:00:00 1000 mg via INTRAVENOUS
  Filled 2019-11-06: qty 100

## 2019-11-06 MED ORDER — PROPOFOL 10 MG/ML IV BOLUS
INTRAVENOUS | Status: DC | PRN
  Administered 2019-11-06: 20 ug via INTRAVENOUS

## 2019-11-06 MED ORDER — DEXAMETHASONE SODIUM PHOSPHATE 10 MG/ML IJ SOLN
INTRAMUSCULAR | Status: AC
  Filled 2019-11-06: qty 1

## 2019-11-06 MED ORDER — CEFAZOLIN SODIUM-DEXTROSE 2-4 GM/100ML-% IV SOLN
2.0000 g | Freq: Four times a day (QID) | INTRAVENOUS | Status: DC
  Administered 2019-11-06: 2 g via INTRAVENOUS

## 2019-11-06 MED ORDER — LACTATED RINGERS IV BOLUS
500.0000 mL | Freq: Once | INTRAVENOUS | Status: AC
  Administered 2019-11-06: 500 mL via INTRAVENOUS

## 2019-11-06 MED ORDER — HYDROCODONE-ACETAMINOPHEN 5-325 MG PO TABS
ORAL_TABLET | ORAL | Status: AC
  Filled 2019-11-06: qty 2

## 2019-11-06 MED ORDER — PHENYLEPHRINE 40 MCG/ML (10ML) SYRINGE FOR IV PUSH (FOR BLOOD PRESSURE SUPPORT)
PREFILLED_SYRINGE | INTRAVENOUS | Status: DC | PRN
  Administered 2019-11-06: 120 ug via INTRAVENOUS

## 2019-11-06 MED ORDER — PHENYLEPHRINE HCL-NACL 10-0.9 MG/250ML-% IV SOLN
INTRAVENOUS | Status: DC | PRN
  Administered 2019-11-06: 40 ug/min via INTRAVENOUS

## 2019-11-06 MED ORDER — TRANEXAMIC ACID-NACL 1000-0.7 MG/100ML-% IV SOLN: 1000.0000 mg | Freq: Once | INTRAVENOUS | Status: DC

## 2019-11-06 MED ORDER — TRANEXAMIC ACID-NACL 1000-0.7 MG/100ML-% IV SOLN
INTRAVENOUS | Status: AC
  Filled 2019-11-06: qty 100

## 2019-11-06 MED ORDER — ONDANSETRON HCL 4 MG/2ML IJ SOLN
INTRAMUSCULAR | Status: AC
  Filled 2019-11-06: qty 2

## 2019-11-06 MED ORDER — HYDROMORPHONE HCL 1 MG/ML IJ SOLN: 0.2500 mg | INTRAMUSCULAR | Status: DC | PRN

## 2019-11-06 MED ORDER — BUPIVACAINE HCL 0.25 % IJ SOLN
INTRAMUSCULAR | Status: AC
  Filled 2019-11-06: qty 1

## 2019-11-06 MED ORDER — METHOCARBAMOL 500 MG PO TABS: 500.0000 mg | ORAL_TABLET | Freq: Four times a day (QID) | ORAL | Status: DC | PRN

## 2019-11-06 MED ORDER — HYDROCODONE-ACETAMINOPHEN 5-325 MG PO TABS
1.0000 | ORAL_TABLET | ORAL | Status: DC | PRN
  Administered 2019-11-06 (×2): 1 via ORAL

## 2019-11-06 MED ORDER — HYDROCODONE-ACETAMINOPHEN 7.5-325 MG PO TABS: 1.0000 | ORAL_TABLET | ORAL | Status: DC | PRN

## 2019-11-06 MED ORDER — CEFAZOLIN SODIUM-DEXTROSE 2-4 GM/100ML-% IV SOLN
INTRAVENOUS | Status: AC
  Filled 2019-11-06: qty 100

## 2019-11-06 MED ORDER — BUPIVACAINE IN DEXTROSE 0.75-8.25 % IT SOLN
INTRATHECAL | Status: DC | PRN
  Administered 2019-11-06: 1.8 mL via INTRATHECAL

## 2019-11-06 MED ORDER — LACTATED RINGERS IV BOLUS
250.0000 mL | Freq: Once | INTRAVENOUS | Status: AC
  Administered 2019-11-06: 14:00:00 250 mL via INTRAVENOUS

## 2019-11-06 MED ORDER — PROMETHAZINE HCL 25 MG/ML IJ SOLN: 6.2500 mg | INTRAMUSCULAR | Status: DC | PRN

## 2019-11-06 MED ORDER — CEFAZOLIN SODIUM-DEXTROSE 2-4 GM/100ML-% IV SOLN
2.0000 g | INTRAVENOUS | Status: AC
  Administered 2019-11-06: 11:00:00 2 g via INTRAVENOUS
  Filled 2019-11-06: qty 100

## 2019-11-06 MED ORDER — LACTATED RINGERS IV SOLN: INTRAVENOUS | Status: DC

## 2019-11-06 MED ORDER — HYDROCODONE-ACETAMINOPHEN 5-325 MG PO TABS: 1.0000 | ORAL_TABLET | ORAL | 0 refills | Status: DC | PRN

## 2019-11-06 MED ORDER — METHOCARBAMOL 500 MG IVPB - SIMPLE MED
500.0000 mg | Freq: Four times a day (QID) | INTRAVENOUS | Status: DC | PRN
  Administered 2019-11-06: 500 mg via INTRAVENOUS

## 2019-11-06 MED ORDER — PROPOFOL 500 MG/50ML IV EMUL
INTRAVENOUS | Status: DC | PRN
  Administered 2019-11-06: 100 ug/kg/min via INTRAVENOUS

## 2019-11-06 MED ORDER — ASPIRIN 81 MG PO CHEW: 81.0000 mg | CHEWABLE_TABLET | Freq: Two times a day (BID) | ORAL | 0 refills | Status: AC

## 2019-11-06 MED ORDER — AMLODIPINE BESYLATE 10 MG PO TABS
10.0000 mg | ORAL_TABLET | Freq: Once | ORAL | Status: DC
  Filled 2019-11-06: qty 1

## 2019-11-06 MED ORDER — PROPOFOL 10 MG/ML IV BOLUS
INTRAVENOUS | Status: AC
  Filled 2019-11-06: qty 20

## 2019-11-06 MED ORDER — METHOCARBAMOL 500 MG IVPB - SIMPLE MED
INTRAVENOUS | Status: AC
  Filled 2019-11-06: qty 50

## 2019-11-06 MED ORDER — FENTANYL CITRATE (PF) 100 MCG/2ML IJ SOLN
INTRAMUSCULAR | Status: DC | PRN
  Administered 2019-11-06: 50 ug via INTRAVENOUS

## 2019-11-06 MED ORDER — SODIUM CHLORIDE 0.9 % IR SOLN
Status: DC | PRN
  Administered 2019-11-06: 1000 mL

## 2019-11-06 MED ORDER — DOCUSATE SODIUM 100 MG PO CAPS: 100.0000 mg | ORAL_CAPSULE | Freq: Two times a day (BID) | ORAL | 0 refills | Status: DC

## 2019-11-06 MED ORDER — FENTANYL CITRATE (PF) 100 MCG/2ML IJ SOLN
INTRAMUSCULAR | Status: AC
  Filled 2019-11-06: qty 2

## 2019-11-06 MED ORDER — METHOCARBAMOL 500 MG PO TABS: 500.0000 mg | ORAL_TABLET | Freq: Four times a day (QID) | ORAL | 0 refills | Status: DC | PRN

## 2019-11-06 MED ORDER — EPHEDRINE SULFATE-NACL 50-0.9 MG/10ML-% IV SOSY
PREFILLED_SYRINGE | INTRAVENOUS | Status: DC | PRN
  Administered 2019-11-06: 10 mg via INTRAVENOUS
  Administered 2019-11-06: 5 mg via INTRAVENOUS
  Administered 2019-11-06: 10 mg via INTRAVENOUS

## 2019-11-06 MED ORDER — DEXAMETHASONE SODIUM PHOSPHATE 10 MG/ML IJ SOLN
10.0000 mg | Freq: Once | INTRAMUSCULAR | Status: AC
  Administered 2019-11-06: 10 mg via INTRAVENOUS

## 2019-11-06 MED ORDER — MIDAZOLAM HCL 2 MG/2ML IJ SOLN
INTRAMUSCULAR | Status: AC
  Filled 2019-11-06: qty 2

## 2019-11-06 SURGICAL SUPPLY — 41 items
BAG DECANTER FOR FLEXI CONT (MISCELLANEOUS) IMPLANT
BAG ZIPLOCK 12X15 (MISCELLANEOUS) IMPLANT
BLADE SAG 18X100X1.27 (BLADE) ×2 IMPLANT
BLADE SURG SZ10 CARB STEEL (BLADE) ×4 IMPLANT
COVER PERINEAL POST (MISCELLANEOUS) ×2 IMPLANT
COVER SURGICAL LIGHT HANDLE (MISCELLANEOUS) ×2 IMPLANT
COVER WAND RF STERILE (DRAPES) IMPLANT
DERMABOND ADVANCED (GAUZE/BANDAGES/DRESSINGS) ×1
DERMABOND ADVANCED .7 DNX12 (GAUZE/BANDAGES/DRESSINGS) ×1 IMPLANT
DRAPE STERI IOBAN 125X83 (DRAPES) ×2 IMPLANT
DRAPE U-SHAPE 47X51 STRL (DRAPES) ×4 IMPLANT
DRESSING AQUACEL AG SP 3.5X10 (GAUZE/BANDAGES/DRESSINGS) ×1 IMPLANT
DRSG AQUACEL AG ADV 3.5X10 (GAUZE/BANDAGES/DRESSINGS) ×1 IMPLANT
DRSG AQUACEL AG SP 3.5X10 (GAUZE/BANDAGES/DRESSINGS) ×2
DURAPREP 26ML APPLICATOR (WOUND CARE) ×2 IMPLANT
ELECT REM PT RETURN 15FT ADLT (MISCELLANEOUS) ×2 IMPLANT
GLOVE BIO SURGEON STRL SZ 6 (GLOVE) ×4 IMPLANT
GLOVE BIOGEL PI IND STRL 6.5 (GLOVE) ×1 IMPLANT
GLOVE BIOGEL PI IND STRL 7.5 (GLOVE) ×1 IMPLANT
GLOVE BIOGEL PI IND STRL 8.5 (GLOVE) ×1 IMPLANT
GLOVE BIOGEL PI INDICATOR 6.5 (GLOVE) ×1
GLOVE BIOGEL PI INDICATOR 7.5 (GLOVE) ×1
GLOVE BIOGEL PI INDICATOR 8.5 (GLOVE) ×1
GLOVE ECLIPSE 8.0 STRL XLNG CF (GLOVE) ×4 IMPLANT
GLOVE ORTHO TXT STRL SZ7.5 (GLOVE) ×4 IMPLANT
GOWN STRL REUS W/TWL LRG LVL3 (GOWN DISPOSABLE) ×4 IMPLANT
GOWN STRL REUS W/TWL XL LVL3 (GOWN DISPOSABLE) ×2 IMPLANT
HEAD CERAMIC 36 PLUS 8.5 12 14 (Hips) ×1 IMPLANT
HOLDER FOLEY CATH W/STRAP (MISCELLANEOUS) ×2 IMPLANT
KIT TURNOVER KIT A (KITS) IMPLANT
PACK ANTERIOR HIP CUSTOM (KITS) ×2 IMPLANT
PENCIL SMOKE EVACUATOR (MISCELLANEOUS) IMPLANT
SUT MNCRL AB 4-0 PS2 18 (SUTURE) ×2 IMPLANT
SUT STRATAFIX 0 PDS 27 VIOLET (SUTURE) ×2
SUT VIC AB 1 CT1 36 (SUTURE) ×6 IMPLANT
SUT VIC AB 2-0 CT1 27 (SUTURE) ×4
SUT VIC AB 2-0 CT1 TAPERPNT 27 (SUTURE) ×2 IMPLANT
SUTURE STRATFX 0 PDS 27 VIOLET (SUTURE) ×1 IMPLANT
TRAY FOLEY MTR SLVR 16FR STAT (SET/KITS/TRAYS/PACK) IMPLANT
WATER STERILE IRR 1000ML POUR (IV SOLUTION) ×2 IMPLANT
YANKAUER SUCT BULB TIP 10FT TU (MISCELLANEOUS) IMPLANT

## 2019-11-06 NOTE — Op Note (Addendum)
Robert Carroll, [facility] RECORD [id number] ACCOUNT [id number] DATE OF BIRTH:1955-07-26 FACILITY: [facility] LOCATION: [facility] PHYSICIAN:Aracelis Ulrey D. Montey Ebel, MD  OPERATIVE REPORT  DATE OF PROCEDURE:  11/06/2019  PREOPERATIVE DIAGNOSIS:  Status post left total hip replacement with persistent anterior-based pain, which was felt to be related to scar tissue as compared to his contralateral hip, which he did very well with.  POSTOPERATIVE DIAGNOSIS:  Status post left total hip replacement with persistent anterior-based pain, which was felt to be related to scar tissue as compared to his contralateral hip, which he did very well with potential subtle instability.  PROCEDURE: 1.  Open excision and non-excisional debridement of the left hip including the skin incision, which was approximately 6-7 inches long.  We used a combination of sharp scalpel as well as the Bovie to perform excisional debridement of nonviable tissue  within the anterior aspect of the joint including bone. 2.  Single component revision of his left total hip.  We exchanged out a size 36+1.5 delta ceramic ball to a 36+8.5 ball.  SURGEON:  Paralee Cancel, MD  ASSISTANT:  Danae Orleans PA-C.  Note that Ms. Babish was present for the entirety of the case from preoperative positioning, perioperative positioning, perioperative management of the operative extremity, general facilitation of the case and primary  wound closure.  ANESTHESIA:  Spinal.  SPECIMENS:  None.  COMPLICATIONS:  None.  FINDINGS:  There were no signs of infection.  He had clear synovial fluid within the joint.  Otherwise, please see dictated operative note for details.  DRAINS:  None.  INDICATIONS:  The patient is a 64 year old male with history of right total hip replacement that he had done very well with.  He subsequently underwent a left total hip replacement.  After initial recovery he began noting anterior-based pain with  activity.  We tried multiple  attempts at conservatively treating the inflammation associated with postoperative scarring; however, he had persistent pain.  Given the recurrence of the persistence of his symptoms and failed attempts at conservative  measure we had a lengthy discussion regarding possible surgical intervention in terms of scar debridement and exploration for potential source of pain.  I reviewed with him that a major risk of this would be the persistence of pain.  Despite the risks  outlined he wished to proceed in this fashion.  Standard risk for infection, DVT, dislocation, particularly in the setting of entering the hip joint could create instability as well as neurovascular injury.  Consent was obtained for benefit of pain  relief.  DESCRIPTION OF PROCEDURE:  The patient was brought to the operative theater.  Once adequate anesthesia, preoperative antibiotics, Ancef administered, he was safely positioned on the Hana table with all bony prominences padded.  The left hip was predraped  then prepped and draped in sterile fashion.  Fluoroscopy was not utilized during the case.  The left hip was then prepped and draped in sterile fashion, identifying his previous incision on the skin.  A timeout was performed identifying the patient, the  planned procedure and extremity.  His old incision was excised in its entirety again, from a 6-8 inch distance.  Soft tissue planes were recreated elevating the subcutaneous tissue off the fascial layer circumferentially around this area.  The fascia of  the tensor fascia muscle was then incised and the muscle was identified and swept laterally.  I then placed retractors superiorly.  We then used hand retractors, Hibbs retractors.  At this point, with the anterior aspect of the hip  I spent a significant  portion of the case excising scar.  As I worked down to the joint, identifying the trunnion of the femoral component we encountered clear synovial fluid.  Given this landmark we then  identified the ceramic head.  Based on just the dissection and the use  of Bovie, as well as the need to evaluate the posterior aspect of the hip, we made a decision at this point to revise the femoral component.  While we were doing this exposure, I did also find that as we rolled to 60 degrees of external rotation, the  femoral head wanted to roll out anteriorly.  It did not dislocate, but definitely came subluxated out of the poly.  This was kept in mind for trial reductions.  At this point, when I had enough scar debridement in the anterior aspect of the hip we  applied traction and then gross traction and then fine traction to subluxate the joint enough.  I then used an impactor and removed the femoral head from the trunnion.  The hip was then rolled out of the way enough to expose the ball so it could be  removed.  Traction was let off.  At this point, a roll of the hip to 100 degrees allowing me to expose the posterior aspect of the hip for debridement.  I identified some superior, anterior and posterior bone, most likely overgrowth versus persistent  osteophyte.  This was excised including the posterior rim.  Further scar debridement was carried out circumferentially around the cup.  The polyethylene was protected as much as possible during the case and not felt to be revised.  Once I felt that I had  sufficiently debrided the scar from the entire hip joint region I retrialed with femoral head trials.  I elected to go with the 8.5 ball because when he rolled to 60 degrees there was no evidence of the subluxation.  Particularly in the setting of  significant scar debridement of the hip joint I wanted to try to prevent dislocation much as possible.  Based on this, the final 36+8.5 ball was opened.  It was impacted on a clean and dried trunnion.  I did use pulse lavage and used about a liter of  irrigation to irrigate the wound out.  The hip was reduced.  Given these findings, there was no capsular  repair as everything had been excised posteriorly.  We reapproximated the fascia of the tensor fascia lata muscle with #1 Vicryl and #1 Stratafix  suture.  The remainder of the wound was closed with 2-0 Vicryl and a running Monocryl stitch.  The hip was then cleaned, dried and dressed sterilely using surgical glue and Aquacel dressing.  He was then brought to the recovery room in stable condition.  Postoperatively, he will be discharged after a stint in the recovery room.  He will be weightbearing as tolerated.  He will be mindful of anterior hip precautions.  We will see him back in the office in 2 weeks to check his wound and review findings.  CN/NUANCE  D:11/06/2019 T:11/06/2019 JOB:[id number]

## 2019-11-06 NOTE — Discharge Instructions (Addendum)

## 2019-11-06 NOTE — Evaluation (Signed)
Physical Therapy Evaluation Patient Details Name: Robert Carroll MRN: [id number] DOB: 04-25-1956 Today's Date: 11/06/2019   History of Present Illness  Patient is 64 y.o. male s/p Lt THA scar debridement on 11/06/19 with PMH significant for OSA, OA, HTN, HLD, GERD, BPH, Rt THA in 2018, and Lt THA on 09/17/18.     Clinical Impression  Robert Carroll is a 64 y.o. male POD 0 s/p Lt THA scar debridement. Patient reports independence with mobility at baseline. Patient is now limited by functional impairments (see PT problem list below) and requires supervision for transfers and gait with RW. Patient was able to ambulate ~130 feet with RW and supervision and cues for safe walker management. Patient educated on safe sequencing for stair mobility and verbalized safe guarding position for people assisting with mobility. Patient instructed in exercises to facilitate ROM and circulation. Patient will benefit from continued skilled PT interventions to address impairments and progress towards PLOF. Patient has met mobility goals at adequate level for discharge home; will continue to follow if pt continues acute stay to progress towards Mod I goals.     Follow Up Recommendations Outpatient PT;Follow surgeon's recommendation for DC plan and follow-up therapies(pt requesting follow up therapy)    Equipment Recommendations  None recommended by PT(pt has all DME)    Recommendations for Other Services       Precautions / Restrictions Precautions Precautions: Fall Restrictions Weight Bearing Restrictions: No Other Position/Activity Restrictions: WBAT      Mobility  Bed Mobility Overal bed mobility: Modified Independent    General bed mobility comments: some increased time needed  Transfers Overall transfer level: Needs assistance Equipment used: Rolling walker (2 wheeled) Transfers: Sit to/from Stand Sit to Stand: Supervision         General transfer comment: cues for safe hand placement, no assist  needed. cues to reach back for controlled lowering.   Ambulation/Gait Ambulation/Gait assistance: Supervision Gait Distance (Feet): 130 Feet Assistive device: Rolling walker (2 wheeled) Gait Pattern/deviations: Step-to pattern;Decreased stride length Gait velocity: fair   General Gait Details: cues or safe proximity to walker for gait and turns at start. no overt LOB noted and pt maintained safe proximity throughout.  Stairs Stairs: Yes Stairs assistance: Supervision Stair Management: One rail Right;Step to pattern;Forwards;With cane Number of Stairs: 6(2x3) General stair comments: cues for safe step pattern "up with good, down with bad". no overt LOB and pt steady with safe sequencing for [place] position. pt verbalized safe pattern and assist needed from wife.  Wheelchair Mobility    Modified Rankin (Stroke Patients Only)       Balance Overall balance assessment: Mild deficits observed, not formally tested          Pertinent Vitals/Pain Pain Assessment: 0-10 Pain Score: 2  Pain Location: Lt hip Pain Descriptors / Indicators: Discomfort Pain Intervention(s): Limited activity within patient's tolerance;Monitored during session;Repositioned;Ice applied    Home Living Family/patient expects to be discharged to:: Private residence Living Arrangements: Spouse/significant other Available Help at Discharge: Family Type of Home: House Home Access: Stairs to enter Entrance Stairs-Rails: Right Entrance Stairs-Number of Steps: 3 at garage with [place], 2+1 with no rail Home Layout: Two level;1/2 bath on main level;Bed/bath upstairs Home Equipment: Cane - single point;Walker - 2 wheels;Bedside commode Additional Comments: pt plans to enter through garage    Prior Function Level of Independence: Independent               Hand Dominance   Dominant Hand: Right  Extremity/Trunk Assessment   Upper Extremity Assessment Upper Extremity Assessment: Overall WFL for tasks  assessed    Lower Extremity Assessment Lower Extremity Assessment: Overall WFL for tasks assessed    Cervical / Trunk Assessment Cervical / Trunk Assessment: Normal  Communication   Communication: No difficulties  Cognition Arousal/Alertness: Awake/alert Behavior During Therapy: WFL for tasks assessed/performed Overall Cognitive Status: Within Functional Limits for tasks assessed           General Comments      Exercises     Assessment/Plan    PT Assessment Patient needs continued PT services  PT Problem List Decreased strength;Decreased range of motion;Decreased activity tolerance;Decreased balance;Decreased mobility;Decreased knowledge of precautions       PT Treatment Interventions DME instruction;Gait training;Stair training;Functional mobility training;Therapeutic activities;Therapeutic exercise;Balance training;Patient/family education    PT Goals (Current goals can be found in the Care Plan section)  Acute Rehab PT Goals Patient Stated Goal: get home today PT Goal Formulation: With patient Time For Goal Achievement: 11/10/19 Potential to Achieve Goals: Good    Frequency Min 5X/week    AM-PAC PT "6 Clicks" Mobility  Outcome Measure Help needed turning from your back to your side while in a flat bed without using bedrails?: None Help needed moving from lying on your back to sitting on the side of a flat bed without using bedrails?: None Help needed moving to and from a bed to a chair (including a wheelchair)?: A Little Help needed standing up from a chair using your arms (e.g., wheelchair or bedside chair)?: A Little Help needed to walk in hospital room?: A Little Help needed climbing 3-5 steps with a railing? : A Little 6 Click Score: 20    End of Session Equipment Utilized During Treatment: Gait belt Activity Tolerance: Patient tolerated treatment well Patient left: in chair;with call bell/phone within reach Nurse Communication: Mobility status(pt still  needs to void bladder) PT Visit Diagnosis: Muscle weakness (generalized) (M62.81);Difficulty in walking, not elsewhere classified (R26.2)    Time: 0355-9741 PT Time Calculation (min) (ACUTE ONLY): 31 min   Charges:   PT Evaluation $PT Eval Low Complexity: 1 Low PT Treatments $Gait Training: 8-22 mins       Verner Mould, DPT Physical Therapist with [facility] [phone]  11/06/2019 3:57 PM

## 2019-11-06 NOTE — Anesthesia Procedure Notes (Signed)
Spinal  Patient location during procedure: OR Start time: 11/06/2019 11:02 AM Staffing Performed: resident/CRNA  Resident/CRNA: [place] Yani Lal C, CRNA Preanesthetic Checklist Completed: patient identified, IV checked, site marked, risks and benefits discussed, surgical consent, monitors and equipment checked, pre-op evaluation and timeout performed Spinal Block Patient position: sitting Prep: DuraPrep and site prepped and draped Patient monitoring: heart rate, cardiac monitor, continuous pulse ox and blood pressure Approach: midline Location: L3-4 Injection technique: single-shot Needle Needle type: Pencan  Needle gauge: 24 G Needle length: 9 cm Assessment Sensory level: T4 Additional Notes IV functioning, monitors applied to pt. Expiration date of kit checked and confirmed to be in date. Sterile prep and drape, hand hygiene and sterile gloved used. Pt was positioned and spine was prepped in sterile fashion. Skin was anesthetized with lidocaine. Free flow of clear CSF obtained prior to injecting local anesthetic into CSF x 1 attempt. Spinal needle aspirated freely following injection. Needle was carefully withdrawn, and pt tolerated procedure well. Loss of motor and sensory on exam post injection.

## 2019-11-06 NOTE — Anesthesia Preprocedure Evaluation (Signed)
Anesthesia Evaluation  Patient identified by MRN, date of birth, ID band Patient awake    Reviewed: Allergy & Precautions, NPO status , Patient's Chart, lab work & pertinent test results  Airway Mallampati: II  TM Distance: >3 FB Neck ROM: Full    Dental no notable dental hx.    Pulmonary sleep apnea and Continuous Positive Airway Pressure Ventilation ,    Pulmonary exam normal breath sounds clear to auscultation       Cardiovascular hypertension, Normal cardiovascular exam Rhythm:Regular Rate:Normal     Neuro/Psych negative neurological ROS  negative psych ROS   GI/Hepatic Neg liver ROS, GERD  ,  Endo/Other  negative endocrine ROS  Renal/GU negative Renal ROS  negative genitourinary   Musculoskeletal negative musculoskeletal ROS (+)   Abdominal   Peds negative pediatric ROS (+)  Hematology negative hematology ROS (+)   Anesthesia Other Findings   Reproductive/Obstetrics negative OB ROS                             Anesthesia Physical Anesthesia Plan  ASA: II  Anesthesia Plan: Spinal   Post-op Pain Management:    Induction: Intravenous  PONV Risk Score and Plan: 2 and Ondansetron, Dexamethasone and Treatment may vary due to age or medical condition  Airway Management Planned: Simple Face Mask  Additional Equipment:   Intra-op Plan:   Post-operative Plan:   Informed Consent: I have reviewed the patients History and Physical, chart, labs and discussed the procedure including the risks, benefits and alternatives for the proposed anesthesia with the patient or authorized representative who has indicated his/her understanding and acceptance.     Dental advisory given  Plan Discussed with: CRNA and Surgeon  Anesthesia Plan Comments:         Anesthesia Quick Evaluation

## 2019-11-06 NOTE — Transfer of Care (Signed)
Immediate Anesthesia Transfer of Care Note  Patient: Robert Carroll  Procedure(s) Performed: Open scar debridement left total hip arthroplasty with single component revision, head ball. (Left )  Patient Location: PACU  Anesthesia Type:Spinal  Level of Consciousness: awake, alert  and oriented  Airway & Oxygen Therapy: Patient Spontanous Breathing and Patient connected to face mask oxygen  Post-op Assessment: Report given to RN and Post -op Vital signs reviewed and stable  Post vital signs: Reviewed and stable  Last Vitals:  Vitals Value Taken Time  BP 106/60 11/06/19 1304  Temp    Pulse 69 11/06/19 1307  Resp 12 11/06/19 1307  SpO2 98 % 11/06/19 1307  Vitals shown include unvalidated device data.  Last Pain:  Vitals:   11/06/19 0900  TempSrc:   PainSc: 0-No pain      Patients Stated Pain Goal: 4 ([id number])  Complications: No apparent anesthesia complications

## 2019-11-06 NOTE — Interval H&P Note (Signed)
History and Physical Interval Note:  11/06/2019 9:32 AM  Robert Carroll  has presented today for surgery, with the diagnosis of Status post left total hip arthroplasty with persisten anterior hip pain.  The various methods of treatment have been discussed with the patient and family. After consideration of risks, benefits and other options for treatment, the patient has consented to  Procedure(s) with comments: Open scar debridement left total hip arthroplasty anterior (Left) - 90 mins Anterior approach as a surgical intervention.  The patient's history has been reviewed, patient examined, no change in status, stable for surgery.  I have reviewed the patient's chart and labs.  Questions were answered to the patient's satisfaction.     Mauri Pole

## 2019-11-06 NOTE — Brief Op Note (Signed)
11/06/2019  10:54 AM  PATIENT:  Robert Carroll  64 y.o. male  PRE-OPERATIVE DIAGNOSIS:  Status post left total hip arthroplasty with persisten anterior hip pain related to scar  POST-OPERATIVE DIAGNOSIS:  Status post left total hip arthroplasty with persisten anterior hip pain related to scar   PROCEDURE:  Procedure(s) with comments: Open scar debridement left total hip arthroplasty anterior with single component revision, head ball  SURGEON:  Surgeon(s) and Role:    Paralee Cancel, MD - Primary  PHYSICIAN ASSISTANT: Danae Orleans, PA-C  ANESTHESIA:   spinal  EBL:  350cc   BLOOD ADMINISTERED:none  DRAINS: none   LOCAL MEDICATIONS USED:  NONE  SPECIMEN:  No Specimen  DISPOSITION OF SPECIMEN:  N/A  COUNTS:  YES  TOURNIQUET:  * No tourniquets in log *  DICTATION: .Other Dictation: Dictation Number [id number]  PLAN OF CARE: Discharge to home after PACU  PATIENT DISPOSITION:  PACU - hemodynamically stable.   Delay start of Pharmacological VTE agent (>24hrs) due to surgical blood loss or risk of bleeding: no

## 2019-11-07 NOTE — Anesthesia Postprocedure Evaluation (Signed)
Anesthesia Post Note  Patient: Robert Carroll  Procedure(s) Performed: Open scar debridement left total hip arthroplasty with single component revision, head ball. (Left )     Patient location during evaluation: PACU Anesthesia Type: Spinal Level of consciousness: oriented and awake and alert Pain management: pain level controlled Vital Signs Assessment: post-procedure vital signs reviewed and stable Respiratory status: spontaneous breathing, respiratory function stable and patient connected to nasal cannula oxygen Cardiovascular status: blood pressure returned to baseline and stable Postop Assessment: no headache, no backache and no apparent nausea or vomiting Anesthetic complications: no    Last Vitals:  Vitals:   11/06/19 1618 11/06/19 1700  BP: (!) 125/91 108/90  Pulse: 100 (!) 103  Resp: 20 18  Temp:  (!) 36.4 C  SpO2: 97% 96%    Last Pain:  Vitals:   11/06/19 1700  TempSrc:   PainSc: 2                  Johndavid Geralds S

## 2020-01-09 ENCOUNTER — Ambulatory Visit (INDEPENDENT_AMBULATORY_CARE_PROVIDER_SITE_OTHER): Payer: 59 | Admitting: Otolaryngology

## 2020-01-09 ENCOUNTER — Encounter (INDEPENDENT_AMBULATORY_CARE_PROVIDER_SITE_OTHER): Payer: Self-pay | Admitting: Otolaryngology

## 2020-01-09 ENCOUNTER — Other Ambulatory Visit: Payer: Self-pay

## 2020-01-09 VITALS — Temp 97.2°F

## 2020-01-09 DIAGNOSIS — H6122 Impacted cerumen, left ear: Secondary | ICD-10-CM | POA: Diagnosis not present

## 2020-01-09 NOTE — Progress Notes (Signed)
HPI: Robert Carroll is a 64 y.o. male who presents for evaluation of intermittent left ear discomfort.  He is presently using CPAP machine for sleep apnea.  Over the past few weeks he has had some intermittent left ear pain..  Past Medical History:  Diagnosis Date  . BPH (benign prostatic hyperplasia)   . Colon polyp    adenomatous, 2008  . Erectile dysfunction   . Genital herpes   . GERD (gastroesophageal reflux disease)   . History of partial seizures    starts with shoulder numbness and goes to fingers  . Hyperlipidemia   . Hypertension   . OA (osteoarthritis)   . OSA on CPAP    Past Surgical History:  Procedure Laterality Date  . BUNIONECTOMY Bilateral   . COLONOSCOPY   2013  . EYE SURGERY     LASIK 2 YEARS AGO   . [name]   right  . THULIUM LASER TURP (TRANSURETHRAL RESECTION OF PROSTATE) N/A 04/18/2019   Procedure: [name] LASER TURP (TRANSURETHRAL RESECTION OF PROSTATE); TRANSURETHRAL INCISION OF THE PROSTATE;  Surgeon: Festus Aloe, MD;  Location: [facility];  Service: Urology;  Laterality: N/A;  . TOTAL HIP ARTHROPLASTY Right 12/26/2016   Procedure: RIGHT TOTAL HIP ARTHROPLASTY ANTERIOR APPROACH;  Surgeon: Paralee Cancel, MD;  Location: [facility];  Service: Orthopedics;  Laterality: Right;  . TOTAL HIP ARTHROPLASTY Left 09/17/2018   Procedure: TOTAL HIP ARTHROPLASTY ANTERIOR APPROACH;  Surgeon: Paralee Cancel, MD;  Location: [facility];  Service: Orthopedics;  Laterality: Left;  70 minutes   Social History   Socioeconomic History  . Marital status: Married    Spouse name: Not on file  . Number of children: 3  . Years of education: 81  . Highest education level: Not on file  Occupational History    Comment: Architectural technologist, administration  Tobacco Use  . Smoking status: Never Smoker  . Smokeless tobacco: Never Used  Vaping Use  . Vaping Use: Never used  Substance and Sexual Activity  . Alcohol use: Yes    Alcohol/week: 8.0  standard drinks    Types: 4 Standard drinks or equivalent, 4 Cans of beer per week    Comment: MIXED DRINK/COCKTAIL 5 weekly  . Drug use: No  . Sexual activity: Not on file  Other Topics Concern  . Not on file  Social History Narrative  . Not on file   Social Determinants of Health   Financial Resource Strain:   . Difficulty of Paying Living Expenses:   Food Insecurity:   . Worried About Charity fundraiser in the Last Year:   . Arboriculturist in the Last Year:   Transportation Needs:   . Film/video editor (Medical):   Marland Kitchen Lack of Transportation (Non-Medical):   Physical Activity:   . Days of Exercise per Week:   . Minutes of Exercise per Session:   Stress:   . Feeling of Stress :   Social Connections:   . Frequency of Communication with Friends and Family:   . Frequency of Social Gatherings with Friends and Family:   . Attends Religious Services:   . Active Member of Clubs or Organizations:   . Attends Archivist Meetings:   Marland Kitchen Marital Status:    Family History  Problem Relation Age of Onset  . Heart disease Mother   . Stroke Mother   . Heart disease Father   . Hypertension Father   . Diabetes Father   .  Heart disease Sister   . Lupus Sister   . Heart disease Brother   . Hypertension Brother   . Diabetes Brother   . Heart disease Maternal Aunt   . Heart disease Maternal Uncle   . Cancer Maternal Uncle        prostate  . Cancer Paternal Uncle        prostate  . Colon cancer Neg Hx   . Stomach cancer Neg Hx    No Known Allergies Prior to Admission medications   Medication Sig Start Date End Date Taking? Authorizing Provider  amLODipine-benazepril (LOTREL) 10-20 MG capsule Take 1 capsule by mouth at bedtime.  01/28/12  Yes [provider]  atorvastatin (LIPITOR) 20 MG tablet Take 20 mg by mouth every evening.  02/17/16  Yes [provider]  Brimonidine Tartrate (LUMIFY) 0.025 % SOLN Place 1 drop into both eyes daily.   Yes [provider]  diphenhydrAMINE (BENADRYL) 25 MG tablet Take 25 mg by mouth daily.   Yes [provider]  docusate sodium (COLACE) 100 MG capsule Take 1 capsule (100 mg total) by mouth 2 (two) times daily. 11/06/19  Yes Babish, Rodman Key, PA-C  doxazosin (CARDURA) 4 MG tablet Take 4 mg by mouth every morning.  01/24/12  Yes [provider]  fluticasone (FLONASE) 50 MCG/ACT nasal spray Place 2 sprays into both nostrils as needed for allergies or rhinitis.    Yes [provider]  gabapentin (NEURONTIN) 300 MG capsule TAKE 3 CAPSULES BY MOUTH 3  TIMES DAILY Patient taking differently: Take 600 mg by mouth 3 (three) times daily.  02/25/18  Yes Penumalli, Earlean Polka, MD  HYDROcodone-acetaminophen (NORCO) 5-325 MG tablet Take 1-2 tablets by mouth every 4 (four) hours as needed for moderate pain or severe pain. 11/06/19  Yes Babish, Rodman Key, PA-C  latanoprost (XALATAN) 0.005 % ophthalmic solution Place 1 drop into both eyes at bedtime. 09/15/19  Yes [provider]  Melatonin 3 MG CAPS Take 3-6 mg by mouth at bedtime as needed (sleep).   Yes [provider]  methocarbamol (ROBAXIN) 500 MG tablet Take 1 tablet (500 mg total) by mouth every 6 (six) hours as needed for muscle spasms. 11/06/19  Yes Babish, Rodman Key, PA-C  Multiple Vitamin (MULTIVITAMIN WITH MINERALS) TABS tablet Take 1 tablet by mouth daily.   Yes [provider]  Omega-3 Fatty Acids (FISH OIL) 1000 MG CAPS Take 1,000 mg by mouth daily.    Yes [provider]  omeprazole (PRILOSEC) 20 MG capsule Take 20 mg by mouth daily before supper.  02/17/12  Yes [provider]  Polyethyl Glycol-Propyl Glycol (SYSTANE OP) Place 1 drop into both eyes daily as needed (dry eyes).   Yes [provider]  polyethylene glycol (MIRALAX / GLYCOLAX) 17 g packet Take 17 g by mouth 2 (two) times daily. 11/06/19  Yes Babish, Rodman Key, PA-C  tadalafil (CIALIS) 5 MG tablet Take 5 mg by mouth daily as needed  for erectile dysfunction.    Yes [provider]  valACYclovir (VALTREX) 1000 MG tablet Take 500 mg by mouth every morning.  01/22/12  Yes [provider]  ferrous sulfate (FERROUSUL) 325 (65 FE) MG tablet Take 1 tablet (325 mg total) by mouth 3 (three) times daily with meals for 14 days. 11/06/19 11/20/19  Danae Orleans, PA-C     Positive ROS: Otherwise negative  All other systems have been reviewed and were otherwise negative with the exception of those mentioned in the HPI and  as above.  Physical Exam: Constitutional: Alert, well-appearing, no acute distress Ears: External ears without lesions or tenderness. Ear canals right ear canal is completely clear.  Left ear canal reveals a large amount of cerumen obstructing the majority of the ear canal that is deep within the ear canal adjacent to the TM.  This was removed with forceps and curettes.  The ear canal and TM were otherwise clear.. Nasal: External nose without lesions. Clear nasal passages Oral: Oropharynx clear. Neck: No palpable adenopathy or masses Respiratory: Breathing comfortably  Skin: No facial/neck lesions or rash noted.  Cerumen impaction removal  Date/Time: 01/09/2020 3:07 PM Performed by: Rozetta Nunnery, MD Authorized by: Rozetta Nunnery, MD   Consent:    Consent obtained:  Verbal   Consent given by:  Patient   Risks discussed:  Pain and bleeding Procedure details:    Location:  L ear   Procedure type: curette and forceps   Post-procedure details:    Inspection:  TM intact and canal normal   Hearing quality:  Improved   Patient tolerance of procedure:  Tolerated well, no immediate complications Comments:     TMs are clear bilaterally.    Assessment: Left ear cerumen impaction  Plan: This was removed in the office today.  He will follow-up as needed.  Radene Journey, MD

## 2020-03-31 ENCOUNTER — Encounter (INDEPENDENT_AMBULATORY_CARE_PROVIDER_SITE_OTHER): Payer: Self-pay

## 2020-03-31 NOTE — Progress Notes (Unsigned)
Faxed Rx request for C-pap machine and accessories of choice with auto titrating with setting of 4-14. Went to [facility]. This was faxed on 12/29/2019.

## 2020-12-26 IMAGING — DX DG PELVIS 1-2V
1 series · 1 of 1 positions shown · non-contrast
Comparison: Plain film from earlier same day.

CLINICAL DATA: Status post closed manip of just placed left total
ant hip that was shown to be dislocated in his first post op image
in PACU.

EXAM:
PELVIS - 1-2 VIEW

[pelvis ap]
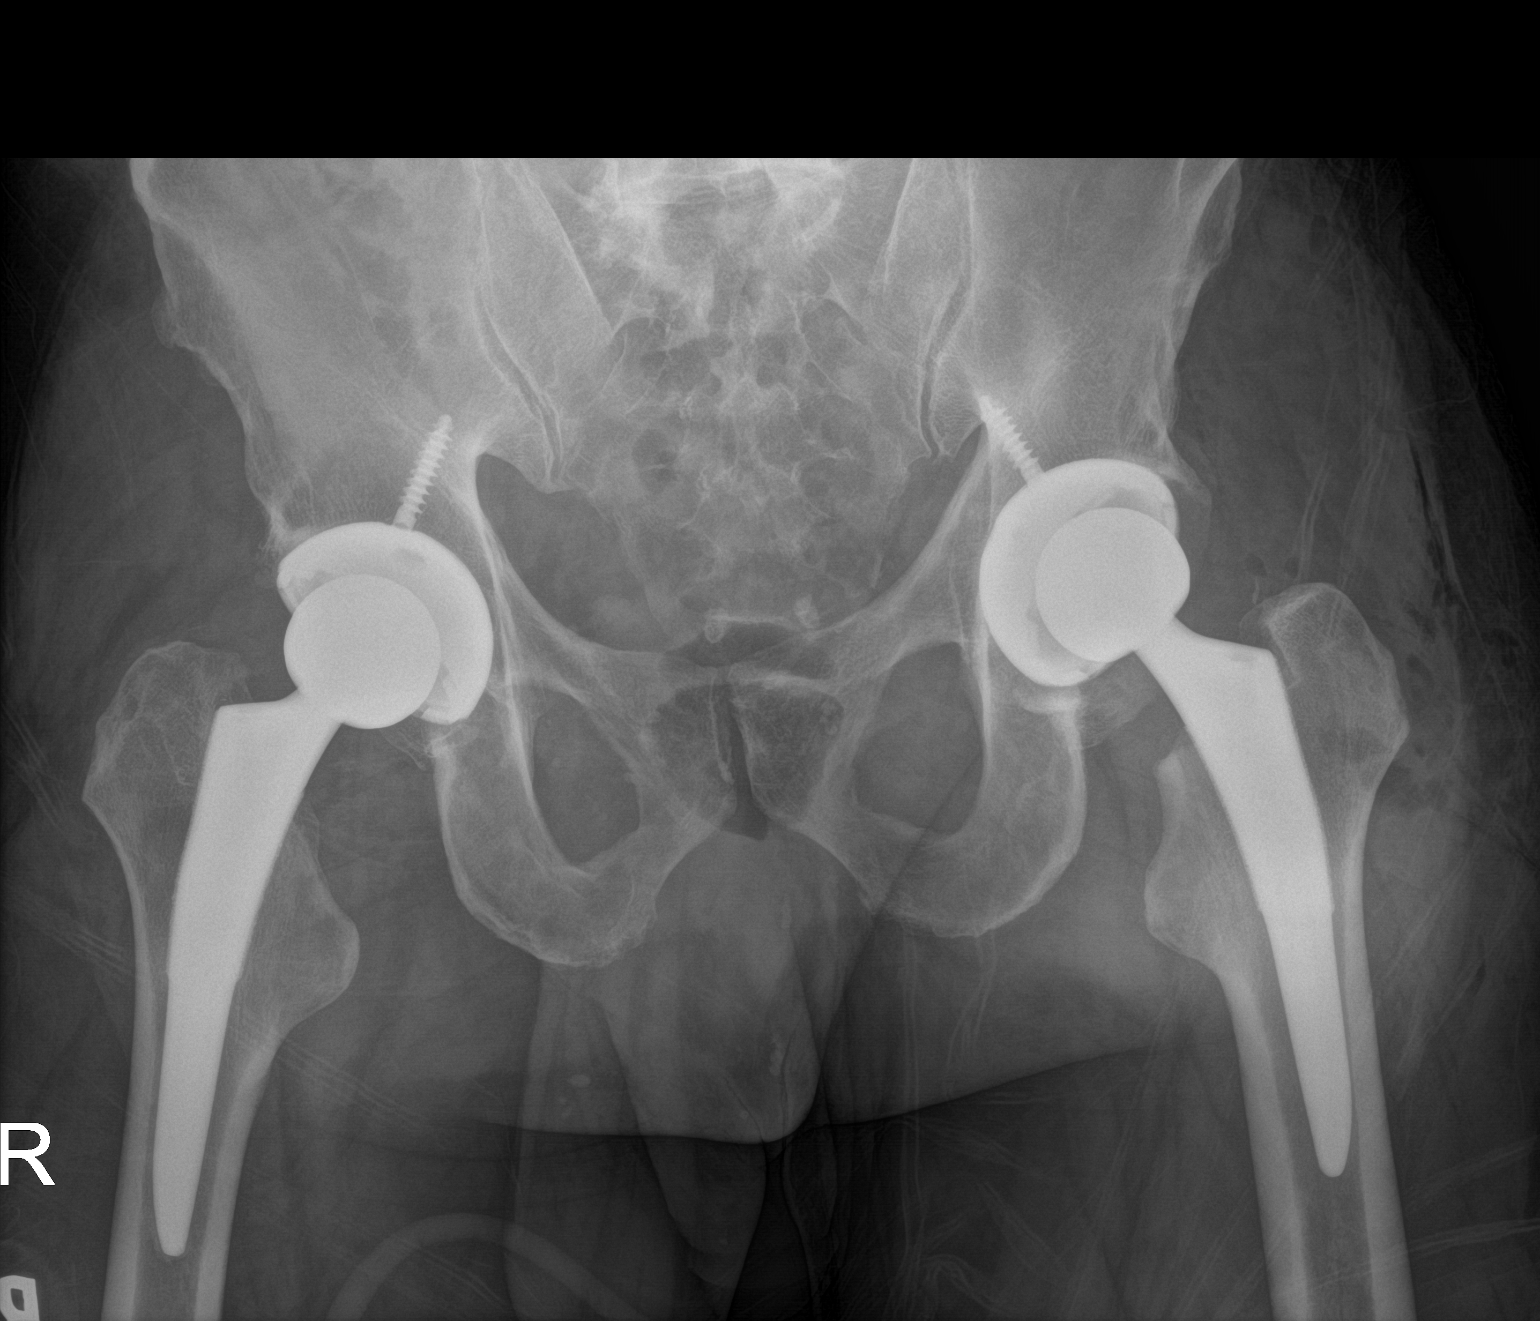

[1 of 1 positions shown; findings below may reference images not displayed]

FINDINGS: The femoral head component of patient's LEFT hip arthroplasty
hardware now appears to be appropriately positioned relative to the
acetabular cup. Expected postsurgical changes within the overlying
soft tissues.
IMPRESSION: LEFT hip arthroplasty hardware now appears to be appropriately
aligned.

## 2021-01-21 DIAGNOSIS — Z8616 Personal history of COVID-19: Secondary | ICD-10-CM

## 2021-01-21 HISTORY — DX: Personal history of COVID-19: Z86.16

## 2021-03-02 ENCOUNTER — Other Ambulatory Visit: Payer: Self-pay | Admitting: Urology

## 2021-03-15 ENCOUNTER — Encounter (HOSPITAL_BASED_OUTPATIENT_CLINIC_OR_DEPARTMENT_OTHER): Payer: Self-pay | Admitting: Urology

## 2021-03-15 ENCOUNTER — Other Ambulatory Visit: Payer: Self-pay

## 2021-03-15 NOTE — Progress Notes (Signed)
Spoke w/ via phone for pre-op interview--- Pt Lab needs dos----   Istat and EKG            Lab results------ no COVID test -----patient states asymptomatic no test needed and pt had positive covid home test 30 days ago Arrive at ------- 1215 on 03-18-2021 NPO after MN NO Solid Food.  Clear liquids from MN until--- 1115 Med rec completed Medications to take morning of surgery ----- Gabapentin, Rapaflo, Doxazosin Diabetic medication ----- n/a Patient instructed no nail polish to be worn day of surgery Patient instructed to bring photo id and insurance card day of surgery Patient aware to have Driver (ride ) / caregiver for 24 hours after surgery --wife, Robert Carroll Patient Special Instructions ----- n/a Pre-Op special Istructions ----- per pt was told from office he is outpatient , was not staying the night Patient verbalized understanding of instructions that were given at this phone interview. Patient denies shortness of breath, chest pain, fever, cough at this phone interview.

## 2021-03-18 ENCOUNTER — Ambulatory Visit (HOSPITAL_BASED_OUTPATIENT_CLINIC_OR_DEPARTMENT_OTHER): Payer: 59 | Admitting: Certified Registered Nurse Anesthetist

## 2021-03-18 ENCOUNTER — Encounter (HOSPITAL_BASED_OUTPATIENT_CLINIC_OR_DEPARTMENT_OTHER)
Admission: RE | Disposition: A | Discharge status: Home / Self Care | Payer: Self-pay | Source: Home / Self Care | Attending: Urology

## 2021-03-18 ENCOUNTER — Encounter (HOSPITAL_BASED_OUTPATIENT_CLINIC_OR_DEPARTMENT_OTHER): Payer: Self-pay | Admitting: Urology

## 2021-03-18 ENCOUNTER — Ambulatory Visit (HOSPITAL_BASED_OUTPATIENT_CLINIC_OR_DEPARTMENT_OTHER)
Admission: RE | Admit: 2021-03-18 | Discharge: 2021-03-18 | Disposition: A | Discharge status: Home / Self Care | Payer: 59 | Attending: Urology | Admitting: Urology

## 2021-03-18 ENCOUNTER — Other Ambulatory Visit: Payer: Self-pay

## 2021-03-18 DIAGNOSIS — Z86010 Personal history of colonic polyps: Secondary | ICD-10-CM | POA: Diagnosis not present

## 2021-03-18 DIAGNOSIS — Z79899 Other long term (current) drug therapy: Secondary | ICD-10-CM | POA: Diagnosis not present

## 2021-03-18 DIAGNOSIS — N401 Enlarged prostate with lower urinary tract symptoms: Secondary | ICD-10-CM | POA: Diagnosis not present

## 2021-03-18 DIAGNOSIS — N138 Other obstructive and reflux uropathy: Secondary | ICD-10-CM

## 2021-03-18 DIAGNOSIS — G4733 Obstructive sleep apnea (adult) (pediatric): Secondary | ICD-10-CM | POA: Diagnosis not present

## 2021-03-18 DIAGNOSIS — Z96643 Presence of artificial hip joint, bilateral: Secondary | ICD-10-CM | POA: Diagnosis not present

## 2021-03-18 DIAGNOSIS — R3915 Urgency of urination: Secondary | ICD-10-CM | POA: Diagnosis not present

## 2021-03-18 DIAGNOSIS — Z7982 Long term (current) use of aspirin: Secondary | ICD-10-CM | POA: Insufficient documentation

## 2021-03-18 DIAGNOSIS — Z8619 Personal history of other infectious and parasitic diseases: Secondary | ICD-10-CM | POA: Diagnosis not present

## 2021-03-18 DIAGNOSIS — Z791 Long term (current) use of non-steroidal anti-inflammatories (NSAID): Secondary | ICD-10-CM | POA: Insufficient documentation

## 2021-03-18 DIAGNOSIS — I10 Essential (primary) hypertension: Secondary | ICD-10-CM | POA: Insufficient documentation

## 2021-03-18 DIAGNOSIS — Z8616 Personal history of COVID-19: Secondary | ICD-10-CM | POA: Insufficient documentation

## 2021-03-18 DIAGNOSIS — Z8249 Family history of ischemic heart disease and other diseases of the circulatory system: Secondary | ICD-10-CM | POA: Insufficient documentation

## 2021-03-18 DIAGNOSIS — R3912 Poor urinary stream: Secondary | ICD-10-CM | POA: Diagnosis not present

## 2021-03-18 DIAGNOSIS — E785 Hyperlipidemia, unspecified: Secondary | ICD-10-CM | POA: Diagnosis not present

## 2021-03-18 DIAGNOSIS — H409 Unspecified glaucoma: Secondary | ICD-10-CM | POA: Insufficient documentation

## 2021-03-18 DIAGNOSIS — Z8669 Personal history of other diseases of the nervous system and sense organs: Secondary | ICD-10-CM | POA: Diagnosis not present

## 2021-03-18 HISTORY — DX: Unspecified glaucoma: H40.9

## 2021-03-18 HISTORY — PX: TRANSURETHRAL RESECTION OF PROSTATE: SHX73

## 2021-03-18 HISTORY — DX: Unspecified symptoms and signs involving the genitourinary system: R39.9

## 2021-03-18 HISTORY — DX: Personal history of adenomatous and serrated colon polyps: Z86.0101

## 2021-03-18 HISTORY — DX: Personal history of colonic polyps: Z86.010

## 2021-03-18 LAB — POCT I-STAT, CHEM 8
BUN: 11 mg/dL (ref 8–23)
Calcium, Ion: 1.26 mmol/L (ref 1.15–1.40)
Chloride: 105 mmol/L (ref 98–111)
Creatinine, Ser: 0.9 mg/dL (ref 0.61–1.24)
Glucose, Bld: 108 mg/dL — ABNORMAL HIGH (ref 70–99)
HCT: 40 % (ref 39.0–52.0)
Hemoglobin: 13.6 g/dL (ref 13.0–17.0)
Potassium: 4 mmol/L (ref 3.5–5.1)
Sodium: 141 mmol/L (ref 135–145)
TCO2: 22 mmol/L (ref 22–32)

## 2021-03-18 SURGERY — TURP (TRANSURETHRAL RESECTION OF PROSTATE)
Anesthesia: General | Site: Prostate

## 2021-03-18 MED ORDER — DEXAMETHASONE SODIUM PHOSPHATE 10 MG/ML IJ SOLN
INTRAMUSCULAR | Status: AC
  Filled 2021-03-18: qty 1

## 2021-03-18 MED ORDER — FENTANYL CITRATE (PF) 100 MCG/2ML IJ SOLN
INTRAMUSCULAR | Status: AC
  Filled 2021-03-18: qty 2

## 2021-03-18 MED ORDER — ONDANSETRON HCL 4 MG/2ML IJ SOLN
INTRAMUSCULAR | Status: DC | PRN
  Administered 2021-03-18: 4 mg via INTRAVENOUS

## 2021-03-18 MED ORDER — ACETAMINOPHEN 10 MG/ML IV SOLN
1000.0000 mg | Freq: Once | INTRAVENOUS | Status: DC | PRN
  Administered 2021-03-18: 1000 mg via INTRAVENOUS

## 2021-03-18 MED ORDER — BELLADONNA ALKALOIDS-OPIUM 16.2-60 MG RE SUPP
RECTAL | Status: DC | PRN
  Administered 2021-03-18: 1 via RECTAL

## 2021-03-18 MED ORDER — CEFAZOLIN SODIUM-DEXTROSE 2-4 GM/100ML-% IV SOLN
INTRAVENOUS | Status: AC
  Filled 2021-03-18: qty 100

## 2021-03-18 MED ORDER — PROMETHAZINE HCL 25 MG/ML IJ SOLN
INTRAMUSCULAR | Status: AC
  Filled 2021-03-18: qty 1

## 2021-03-18 MED ORDER — CEFAZOLIN SODIUM-DEXTROSE 2-4 GM/100ML-% IV SOLN
2.0000 g | Freq: Once | INTRAVENOUS | Status: AC
  Administered 2021-03-18: 2 g via INTRAVENOUS

## 2021-03-18 MED ORDER — LACTATED RINGERS IV SOLN: INTRAVENOUS | Status: DC

## 2021-03-18 MED ORDER — FENTANYL CITRATE (PF) 100 MCG/2ML IJ SOLN
INTRAMUSCULAR | Status: DC | PRN
  Administered 2021-03-18: 50 ug via INTRAVENOUS

## 2021-03-18 MED ORDER — FENTANYL CITRATE (PF) 100 MCG/2ML IJ SOLN
25.0000 ug | INTRAMUSCULAR | Status: DC | PRN
  Administered 2021-03-18 (×2): 25 ug via INTRAVENOUS

## 2021-03-18 MED ORDER — DEXAMETHASONE SODIUM PHOSPHATE 10 MG/ML IJ SOLN
INTRAMUSCULAR | Status: DC | PRN
  Administered 2021-03-18: 10 mg via INTRAVENOUS

## 2021-03-18 MED ORDER — ACETAMINOPHEN 10 MG/ML IV SOLN
INTRAVENOUS | Status: AC
  Filled 2021-03-18: qty 100

## 2021-03-18 MED ORDER — PROMETHAZINE HCL 25 MG/ML IJ SOLN: 6.2500 mg | INTRAMUSCULAR | Status: DC | PRN

## 2021-03-18 MED ORDER — MIDAZOLAM HCL 5 MG/5ML IJ SOLN
INTRAMUSCULAR | Status: DC | PRN
  Administered 2021-03-18: 2 mg via INTRAVENOUS

## 2021-03-18 MED ORDER — SODIUM CHLORIDE 0.9 % IR SOLN
Status: DC | PRN
  Administered 2021-03-18: 6000 mL

## 2021-03-18 MED ORDER — ONDANSETRON HCL 4 MG/2ML IJ SOLN
INTRAMUSCULAR | Status: AC
  Filled 2021-03-18: qty 2

## 2021-03-18 MED ORDER — PROPOFOL 10 MG/ML IV BOLUS
INTRAVENOUS | Status: DC | PRN
  Administered 2021-03-18 (×2): 50 mg via INTRAVENOUS
  Administered 2021-03-18: 200 mg via INTRAVENOUS

## 2021-03-18 MED ORDER — LIDOCAINE HCL (PF) 2 % IJ SOLN
INTRAMUSCULAR | Status: AC
  Filled 2021-03-18: qty 5

## 2021-03-18 MED ORDER — STERILE WATER FOR IRRIGATION IR SOLN
Status: DC | PRN
  Administered 2021-03-18: 500 mL

## 2021-03-18 MED ORDER — LIDOCAINE 2% (20 MG/ML) 5 ML SYRINGE
INTRAMUSCULAR | Status: DC | PRN
  Administered 2021-03-18: 100 mg via INTRAVENOUS

## 2021-03-18 MED ORDER — MIDAZOLAM HCL 2 MG/2ML IJ SOLN
INTRAMUSCULAR | Status: AC
  Filled 2021-03-18: qty 2

## 2021-03-18 MED ORDER — BELLADONNA ALKALOIDS-OPIUM 16.2-60 MG RE SUPP
RECTAL | Status: AC
  Filled 2021-03-18: qty 1

## 2021-03-18 SURGICAL SUPPLY — 20 items
BAG DRAIN URO-CYSTO SKYTR STRL (DRAIN) ×2 IMPLANT
BAG URINE DRAIN 2000ML AR STRL (UROLOGICAL SUPPLIES) ×2 IMPLANT
CATH FOLEY 2WAY SLVR  5CC 18FR (CATHETERS) ×2
CATH FOLEY 2WAY SLVR 5CC 18FR (CATHETERS) ×1 IMPLANT
CLOTH BEACON ORANGE TIMEOUT ST (SAFETY) ×2 IMPLANT
GLOVE SURG ENC MOIS LTX SZ6 (GLOVE) ×2 IMPLANT
GLOVE SURG ENC MOIS LTX SZ7.5 (GLOVE) ×2 IMPLANT
GLOVE SURG UNDER POLY LF SZ6 (GLOVE) ×2 IMPLANT
GLOVE SURG UNDER POLY LF SZ7 (GLOVE) ×2 IMPLANT
GOWN STRL REUS W/TWL LRG LVL3 (GOWN DISPOSABLE) ×6 IMPLANT
HOLDER FOLEY CATH W/STRAP (MISCELLANEOUS) ×2 IMPLANT
IV NS IRRIG 3000ML ARTHROMATIC (IV SOLUTION) ×4 IMPLANT
KIT TURNOVER CYSTO (KITS) ×2 IMPLANT
LOOP CUT BIPOLAR 24F LRG (ELECTROSURGICAL) ×2 IMPLANT
MANIFOLD NEPTUNE II (INSTRUMENTS) ×2 IMPLANT
PACK CYSTO (CUSTOM PROCEDURE TRAY) ×2 IMPLANT
SYR 30ML LL (SYRINGE) ×2 IMPLANT
TUBE CONNECTING 12X1/4 (SUCTIONS) ×2 IMPLANT
TUBING UROLOGY SET (TUBING) ×2 IMPLANT
WATER STERILE IRR 500ML POUR (IV SOLUTION) ×2 IMPLANT

## 2021-03-18 NOTE — Anesthesia Postprocedure Evaluation (Signed)
Anesthesia Post Note  Patient: Robert Carroll  Procedure(s) Performed: TRANSURETHRAL RESECTION OF THE PROSTATE (TURP) (Prostate)     Patient location during evaluation: PACU Anesthesia Type: General Level of consciousness: awake and alert Pain management: pain level controlled Vital Signs Assessment: post-procedure vital signs reviewed and stable Respiratory status: spontaneous breathing, nonlabored ventilation and respiratory function stable Cardiovascular status: blood pressure returned to baseline and stable Postop Assessment: no apparent nausea or vomiting Anesthetic complications: no   No notable events documented.  Last Vitals:  Vitals:   03/18/21 1615 03/18/21 1630  BP: 126/80 130/67  Pulse: 60 (!) 48  Resp: 16 11  Temp:    SpO2: 95% 98%    Last Pain:  Vitals:   03/18/21 1630  TempSrc:   PainSc: 4                  Lidia Collum

## 2021-03-18 NOTE — Transfer of Care (Signed)
Immediate Anesthesia Transfer of Care Note  Patient: Robert Carroll  Procedure(s) Performed: TRANSURETHRAL RESECTION OF THE PROSTATE (TURP) (Prostate)  Patient Location: PACU  Anesthesia Type:General  Level of Consciousness: drowsy and patient cooperative  Airway & Oxygen Therapy: Patient Spontanous Breathing and Patient connected to nasal cannula oxygen  Post-op Assessment: Report given to RN  Post vital signs: Reviewed and stable  Last Vitals:  Vitals Value Taken Time  BP 133/81 03/18/21 1532  Temp    Pulse 62 03/18/21 1534  Resp 11 03/18/21 1534  SpO2 97 % 03/18/21 1534  Vitals shown include unvalidated device data.  Last Pain:  Vitals:   03/18/21 1255  TempSrc:   PainSc: 0-No pain      Patients Stated Pain Goal: 7 ([id number])  Complications: No notable events documented.

## 2021-03-18 NOTE — Anesthesia Procedure Notes (Addendum)
Procedure Name: LMA Insertion Date/Time: 03/18/2021 2:55 PM Performed by: Bonney Aid, CRNA Pre-anesthesia Checklist: Patient identified, Emergency Drugs available, Suction available and Patient being monitored Patient Re-evaluated:Patient Re-evaluated prior to induction Oxygen Delivery Method: Circle system utilized Preoxygenation: Pre-oxygenation with 100% oxygen Induction Type: IV induction Ventilation: Mask ventilation without difficulty LMA: LMA inserted LMA Size: 5.0 Number of attempts: 1 Airway Equipment and Method: Bite block Placement Confirmation: positive ETCO2 Tube secured with: Tape Dental Injury: Teeth and Oropharynx as per pre-operative assessment

## 2021-03-18 NOTE — Op Note (Signed)
Preoperative diagnosis: BPH with lower urinary tract symptoms Postoperative diagnosis: Same  Procedure: Transurethral resection of prostate  Surgeon: Junious Silk  Anesthesia: General  Indication for procedure: [name] is a 65 year old male with a history of BPH and lower urinary tract symptoms.  We done laser vaporization and incision of the prostate a couple of years ago but he had return of obstructive and irritative symptoms.  Silodosin has helped and he elects to proceed with TURP.  Findings: On exam under anesthesia the penis was circumcised and without mass or lesion.  Testicles descended bilaterally and palpably normal.  On DRE the prostate was about 30 g and smooth without hard area or nodule.  On cystoscopy the urethra was unremarkable, the prostatic urethra was obstructed by a small median lobe and some mild lateral lobe hypertrophy.  Prostate urethra was short.  Bladder mucosa appeared normal without lesion.  No stone or foreign body in the bladder.  Mild trabeculation.  Ureteral orifice ease were identified.  Description of procedure: After consent was obtained patient brought to the operating room.  After adequate anesthesia was placed lithotomy position and prepped and draped in the usual sterile fashion.  A timeout was performed to confirm the patient and procedure.  The resectoscope with a visual obturator was passed per urethra into the bladder and the bladder inspected.  The loop and handle was then passed.  The ureteral orifice ease were identified.  I could see my prior laser incisions down to the bladder neck at 5 and 7:00 and came just inside the bladder neck and made new incisions down toward the verumontanum.  I then resected some median lobe tissue but left the posterior strip.  I then resected the left lateral lobe anterior to posterior bladder neck to the veru and similarly the right lateral lobe.  This created an excellent channel.  Resection was between bladder neck and  verumontanum.  Ureteral orifice ease were identified again and noted to be normal.  The chips were drained.  Hemostasis was excellent at low pressure.  The scope was removed and 18 [place] two-way catheter was placed left to gravity drainage with clear urine.  I did an exam under anesthesia and placed a BNO suppository.  He was then taken out of lithotomy awakened and taken to the recovery room in stable condition.  Complications: None  Blood loss: 30 mL  Specimens to pathology: Prostate chips  Drains: 18 French two-way Foley catheter  Disposition: Patient stable to PACU-I spoke with his wife in recovery and we went over the procedure, postop care and follow-up.  All questions answered.  She is a Marine scientist and they might remove his catheter next Tuesday morning and lieu of coming into the office.  I told him to let us know if they have any issues.

## 2021-03-18 NOTE — Anesthesia Preprocedure Evaluation (Signed)
Anesthesia Evaluation  Patient identified by MRN, date of birth, ID band Patient awake    Reviewed: Allergy & Precautions, NPO status , Patient's Chart, lab work & pertinent test results  Airway Mallampati: II  TM Distance: >3 FB Neck ROM: Full    Dental  (+) Teeth Intact   Pulmonary sleep apnea and Continuous Positive Airway Pressure Ventilation ,    Pulmonary exam normal        Cardiovascular hypertension, Pt. on medications  Rhythm:Regular Rate:Normal     Neuro/Psych Seizures -, Well Controlled,  negative psych ROS   GI/Hepatic Neg liver ROS, GERD  Medicated,  Endo/Other  negative endocrine ROS  Renal/GU negative Renal ROS   BPH    Musculoskeletal  (+) Arthritis , Osteoarthritis,    Abdominal (+)  Abdomen: soft. Bowel sounds: normal.  Peds  Hematology negative hematology ROS (+)   Anesthesia Other Findings   Reproductive/Obstetrics                             Anesthesia Physical Anesthesia Plan  ASA: 3  Anesthesia Plan: General   Post-op Pain Management:    Induction: Intravenous  PONV Risk Score and Plan: 2 and Ondansetron, Dexamethasone and Treatment may vary due to age or medical condition  Airway Management Planned: Mask and LMA  Additional Equipment: None  Intra-op Plan:   Post-operative Plan: Extubation in OR  Informed Consent: I have reviewed the patients History and Physical, chart, labs and discussed the procedure including the risks, benefits and alternatives for the proposed anesthesia with the patient or authorized representative who has indicated his/her understanding and acceptance.       Plan Discussed with: CRNA  Anesthesia Plan Comments:         Anesthesia Quick Evaluation

## 2021-03-18 NOTE — H&P (Signed)
H&P  Chief Complaint: BPH with lower urinary tract symptoms  History of Present Illness: [name] is a 65 year old male with a history of BPH and lower urinary tract symptoms.  He underwent a laser transurethral incision or vaporization of the prostate September 2020.  He was able to get off medication and did well for a while but more recently has had increased lower urinary tract symptoms with a weak stream frequency and urgency.  He restarted silodosin which helped a lot.  Cystoscopy July 2022 revealed a partially resected prostatic fossa with some obstruction.  He presents today for TURP and attempt to open the channel back up and get off medication.  His May 2022 PSA was 0.4.  He has had no recent dysuria or gross hematuria.  No fever.  Past Medical History:  Diagnosis Date   BPH (benign prostatic hyperplasia)    Erectile dysfunction    Genital herpes    GERD (gastroesophageal reflux disease)    Glaucoma, both eyes    History of adenomatous polyp of colon    History of COVID-19 01/2021   per pt positive home test with mild symptoms that resolved   History of partial seizures    pt stated symptoms start with shoulder numbness and goes to fingers  controlled w/ gapabentin;  previously seen by neurologist-- dr Leta Baptist   Hyperlipidemia    Hypertension    followed yb pcp   Lower urinary tract symptoms (LUTS)    OA (osteoarthritis)    OSA on CPAP    per study in [facility] 11-07-2008 mild osa   Past Surgical History:  Procedure Laterality Date   BUNIONECTOMY Bilateral    COLONOSCOPY   2013   [place], 1977   right   THULIUM LASER TURP (TRANSURETHRAL RESECTION OF PROSTATE) N/A 04/18/2019   Procedure: [name] LASER TURP (TRANSURETHRAL RESECTION OF PROSTATE); TRANSURETHRAL INCISION OF THE PROSTATE;  Surgeon: Festus Aloe, MD;  Location: [facility];  Service: Urology;  Laterality: N/A;   TOTAL HIP ARTHROPLASTY Right 12/26/2016   Procedure: RIGHT TOTAL HIP  ARTHROPLASTY ANTERIOR APPROACH;  Surgeon: Paralee Cancel, MD;  Location: [facility];  Service: Orthopedics;  Laterality: Right;   TOTAL HIP ARTHROPLASTY Left 09/17/2018   Procedure: TOTAL HIP ARTHROPLASTY ANTERIOR APPROACH;  Surgeon: Paralee Cancel, MD;  Location: [facility];  Service: Orthopedics;  Laterality: Left;  70 minutes    Home Medications:  Medications Prior to Admission  Medication Sig Dispense Refill Last Dose   amLODipine-benazepril (LOTREL) 10-20 MG capsule Take 1 capsule by mouth at bedtime.    03/17/2021 at 2200   aspirin EC 81 MG tablet Take 81 mg by mouth daily. Swallow whole.   Past Week   atorvastatin (LIPITOR) 20 MG tablet Take 20 mg by mouth every evening.    03/17/2021 at 1800   Brimonidine Tartrate (LUMIFY) 0.025 % SOLN Place 1 drop into both eyes daily.   03/18/2021   diclofenac (VOLTAREN) 75 MG EC tablet Take 75 mg by mouth 2 (two) times daily.   Past Week   diphenhydrAMINE (BENADRYL) 25 MG tablet Take 25 mg by mouth daily as needed.   Past Week   doxazosin (CARDURA) 4 MG tablet Take 4 mg by mouth every morning.   03/18/2021 at 0600   fluticasone (FLONASE) 50 MCG/ACT nasal spray Place 2 sprays into both nostrils as needed for allergies or rhinitis.    03/18/2021 at 0600   gabapentin (NEURONTIN) 300 MG capsule TAKE 3 CAPSULES BY MOUTH  3  TIMES DAILY (Patient taking differently: Take 600 mg by mouth 3 (three) times daily.) 810 capsule 0 03/18/2021 at 0600   latanoprost (XALATAN) 0.005 % ophthalmic solution Place 1 drop into both eyes at bedtime.   03/17/2021 at 2200   Melatonin 3 MG CAPS Take 3-6 mg by mouth at bedtime as needed (sleep).   03/18/2021 at 2200   Multiple Vitamin (MULTIVITAMIN WITH MINERALS) TABS tablet Take 1 tablet by mouth daily.   Past Month   Omega-3 Fatty Acids (FISH OIL) 1000 MG CAPS Take 1,000 mg by mouth daily.    Past Month   omeprazole (PRILOSEC) 20 MG capsule Take 20 mg by mouth daily before supper.    03/17/2021 at 1800   Polyethyl Glycol-Propyl Glycol (SYSTANE  OP) Place 1 drop into both eyes daily as needed (dry eyes).   03/18/2021   Polyethylene Glycol 3350 (MIRALAX PO) Take by mouth daily as needed.   Past Week   silodosin (RAPAFLO) 4 MG CAPS capsule Take 4 mg by mouth daily.   03/18/2021 at 0600   tadalafil (CIALIS) 5 MG tablet Take 5 mg by mouth daily as needed for erectile dysfunction.    03/17/2021 at 2130   valACYclovir (VALTREX) 1000 MG tablet Take 500 mg by mouth every morning.    03/17/2021 at 1500   Allergies: No Known Allergies  Family History  Problem Relation Age of Onset   Heart disease Mother    Stroke Mother    Heart disease Father    Hypertension Father    Diabetes Father    Heart disease Sister    Lupus Sister    Heart disease Brother    Hypertension Brother    Diabetes Brother    Heart disease Maternal Aunt    Heart disease Maternal Uncle    Cancer Maternal Uncle        prostate   Cancer Paternal Uncle        prostate   Colon cancer Neg Hx    Stomach cancer Neg Hx    Social History:  reports that he has never smoked. He has never used smokeless tobacco. He reports current alcohol use of about 5.0 standard drinks per week. He reports that he does not use drugs.  ROS: A complete review of systems was performed.  All systems are negative except for pertinent findings as noted. Review of Systems  All other systems reviewed and are negative.   Physical Exam:  Vital signs in last 24 hours: Temp:  [98.8 F (37.1 C)] 98.8 F (37.1 C) (08/26 1254) Pulse Rate:  [73] 73 (08/26 1254) Resp:  [16] 16 (08/26 1254) BP: (128)/(88) 128/88 (08/26 1254) SpO2:  [97 %] 97 % (08/26 1254) Weight:  [100.5 kg] 100.5 kg (08/26 1254) General:  Alert and oriented, No acute distress HEENT: Normocephalic, atraumatic Cardiovascular: Regular rate and rhythm Lungs: Regular rate and effort Abdomen: Soft, nontender, nondistended, no abdominal masses Back: No CVA tenderness Extremities: No edema Neurologic: Grossly intact  Laboratory  Data:  Results for orders placed or performed during the hospital encounter of 03/18/21 (from the past 24 hour(s))  I-STAT, chem 8     Status: Abnormal   Collection Time: 03/18/21 12:51 PM  Result Value Ref Range   Sodium 141 135 - 145 mmol/L   Potassium 4.0 3.5 - 5.1 mmol/L   Chloride 105 98 - 111 mmol/L   BUN 11 8 - 23 mg/dL   Creatinine, Ser 0.90 0.61 - 1.24 mg/dL  Glucose, Bld 108 (H) 70 - 99 mg/dL   Calcium, Ion 1.26 1.15 - 1.40 mmol/L   TCO2 22 22 - 32 mmol/L   Hemoglobin 13.6 13.0 - 17.0 g/dL   HCT 40.0 39.0 - 52.0 %   No results found for this or any previous visit (from the past 240 hour(s)). Creatinine: Recent Labs    03/18/21 1251  CREATININE 0.90    Impression/Assessment:  BPH, weak stream-frequency-  Plan:   I discussed with the patient the nature, potential benefits, risks and alternatives to transurethral resection of prostate, including side effects of the proposed treatment, the likelihood of the patient achieving the goals of the procedure, and any potential problems that might occur during the procedure or recuperation.  We discussed expectations for flow versus irritative symptoms.  We discussed risk of infection, incontinence or retrograde ejaculation among others.  All questions answered. Patient elects to proceed.   Festus Aloe 03/18/2021, 2:43 PM

## 2021-03-18 NOTE — Discharge Instructions (Addendum)
Removal of the Foley catheter: Okay to remove Foley catheter on Tuesday morning March 22, 2021 as instructed   Post Anesthesia Home Care Instructions  Activity: Get plenty of rest for the remainder of the day. A responsible individual must stay with you for 24 hours following the procedure.  For the next 24 hours, DO NOT: -Drive a car -Paediatric nurse -Drink alcoholic beverages -Take any medication unless instructed by your physician -Make any legal decisions or sign important papers.  Meals: Start with liquid foods such as gelatin or soup. Progress to regular foods as tolerated. Avoid greasy, spicy, heavy foods. If nausea and/or vomiting occur, drink only clear liquids until the nausea and/or vomiting subsides. Call your physician if vomiting continues.  Special Instructions/Symptoms: Your throat may feel dry or sore from the anesthesia or the breathing tube placed in your throat during surgery. If this causes discomfort, gargle with warm salt water. The discomfort should disappear within 24 hours.  If you had a scopolamine patch placed behind your ear for the management of post- operative nausea and/or vomiting:  1. The medication in the patch is effective for 72 hours, after which it should be removed.  Wrap patch in a tissue and discard in the trash. Wash hands thoroughly with soap and water. 2. You may remove the patch earlier than 72 hours if you experience unpleasant side effects which may include dry mouth, dizziness or visual disturbances. 3. Avoid touching the patch. Wash your hands with soap and water after contact with the patch.  Do not take any Tylenol until after 10:30 pm today.

## 2021-03-21 ENCOUNTER — Encounter (HOSPITAL_BASED_OUTPATIENT_CLINIC_OR_DEPARTMENT_OTHER): Payer: Self-pay | Admitting: Urology

## 2021-03-21 LAB — SURGICAL PATHOLOGY

## 2022-01-16 ENCOUNTER — Encounter: Payer: Self-pay | Admitting: Gastroenterology

## 2022-03-03 ENCOUNTER — Ambulatory Visit (AMBULATORY_SURGERY_CENTER): Payer: Medicare Other

## 2022-03-03 ENCOUNTER — Telehealth: Payer: Self-pay | Admitting: Internal Medicine

## 2022-03-03 VITALS — Ht 73.0 in | Wt 226.0 lb

## 2022-03-03 DIAGNOSIS — Z860100 Personal history of colon polyps, unspecified: Secondary | ICD-10-CM

## 2022-03-03 MED ORDER — NA SULFATE-K SULFATE-MG SULF 17.5-3.13-1.6 GM/177ML PO SOLN: 1.0000 | Freq: Once | ORAL | 0 refills | Status: AC

## 2022-03-03 NOTE — Progress Notes (Signed)
No egg or soy allergy known to patient   No issues known to pt with past sedation with any surgeries or procedures  Patient denies ever being told they had issues or difficulty with intubation   No FH of Malignant Hyperthermia Pt is not on diet pills  Pt is not on  home 02  Pt is not on blood thinners  Pt denies issues with constipation  No A fib or A flutter  Have any cardiac testing pending--no  Pt instructed to use Single[employer]com or GoodRx for a price reduction on prep    DJ pt had to r/s to [facility], pt request a call for any am cancellations to come in sooner than 9/28

## 2022-03-03 NOTE — Telephone Encounter (Signed)
Patient called to request his prep be sent to [facility] on [address]

## 2022-03-03 NOTE — Telephone Encounter (Signed)
Please see the previous message - this patient's PV was today

## 2022-03-06 ENCOUNTER — Telehealth: Payer: Self-pay

## 2022-03-06 MED ORDER — NA SULFATE-K SULFATE-MG SULF 17.5-3.13-1.6 GM/177ML PO SOLN: 1.0000 | Freq: Once | ORAL | 0 refills | Status: AC

## 2022-03-06 NOTE — Telephone Encounter (Signed)
Let pt know that Rx was sent in this AM, pt states he just got [facility] that it was ready and appreciated the call

## 2022-03-06 NOTE — Telephone Encounter (Signed)
Patient called to follow up on message from 8/11. Patient needs prep to be sent to [facility] on [address]. Please advise.

## 2022-03-21 ENCOUNTER — Encounter: Payer: Self-pay | Admitting: Internal Medicine

## 2022-03-24 ENCOUNTER — Encounter: Payer: 59 | Admitting: Gastroenterology

## 2022-04-06 ENCOUNTER — Ambulatory Visit (AMBULATORY_SURGERY_CENTER): Payer: Medicare Other | Admitting: Internal Medicine

## 2022-04-06 ENCOUNTER — Encounter: Payer: Self-pay | Admitting: Internal Medicine

## 2022-04-06 VITALS — BP 136/71 | HR 48 | Temp 96.8°F | Resp 15 | Ht 73.0 in | Wt 226.0 lb

## 2022-04-06 DIAGNOSIS — Z86010 Personal history of colonic polyps: Secondary | ICD-10-CM

## 2022-04-06 DIAGNOSIS — Z09 Encounter for follow-up examination after completed treatment for conditions other than malignant neoplasm: Secondary | ICD-10-CM | POA: Diagnosis not present

## 2022-04-06 MED ORDER — SODIUM CHLORIDE 0.9 % IV SOLN: 500.0000 mL | Freq: Once | INTRAVENOUS | Status: DC

## 2022-04-06 NOTE — Op Note (Signed)
[place] Patient Name: Robert Carroll Procedure Date: 04/06/2022 9:46 AM MRN: [id number] Endoscopist: Jerene Bears , MD Age: 66 Referring MD:  Date of Birth: [birth year] Gender: Male Account #: [id number] Procedure:                Colonoscopy Indications:              High risk colon cancer surveillance: Personal                            history of non-advanced adenoma (2008), Last                            colonoscopy: August 2013 (normal) Medicines:                Monitored Anesthesia Care Procedure:                Pre-Anesthesia Assessment:                           - Prior to the procedure, a History and Physical                            was performed, and patient medications and                            allergies were reviewed. The patient's tolerance of                            previous anesthesia was also reviewed. The risks                            and benefits of the procedure and the sedation                            options and risks were discussed with the patient.                            All questions were answered, and informed consent                            was obtained. Prior Anticoagulants: The patient has                            taken no previous anticoagulant or antiplatelet                            agents. ASA Grade Assessment: II - A patient with                            mild systemic disease. After reviewing the risks                            and benefits, the patient was deemed in  satisfactory condition to undergo the procedure.                           After obtaining informed consent, the colonoscope                            was passed under direct vision. Throughout the                            procedure, the patient's blood pressure, pulse, and                            oxygen saturations were monitored continuously. The                            CF [id number] #[id number] was introduced  through the anus                            and advanced to the cecum, identified by                            appendiceal orifice and ileocecal valve. The                            colonoscopy was performed without difficulty. The                            patient tolerated the procedure well. The quality                            of the bowel preparation was good. The ileocecal                            valve, appendiceal orifice, and rectum were                            photographed. Scope In: 9:55:25 AM Scope Out: 10:09:19 AM Scope Withdrawal Time: 0 hours 11 minutes 3 seconds  Total Procedure Duration: 0 hours 13 minutes 54 seconds  Findings:                 The perianal and digital rectal examinations were                            normal.                           A few small-mouthed diverticula were found in the                            ascending colon.                           The exam was otherwise without abnormality.  Internal hemorrhoids were found during                            retroflexion. The hemorrhoids were small. Complications:            No immediate complications. Estimated Blood Loss:     Estimated blood loss: none. Impression:               - Diverticulosis in the ascending colon.                           - The examination was otherwise normal.                           - Small internal hemorrhoids.                           - No specimens collected. Recommendation:           - Patient has a contact number available for                            emergencies. The signs and symptoms of potential                            delayed complications were discussed with the                            patient. Return to normal activities tomorrow.                            Written discharge instructions were provided to the                            patient.                           - Resume previous diet.                            - Continue present medications.                           - Repeat colonoscopy in 10 years for surveillance                            with Dr. Ardis Hughs. Jerene Bears, MD 04/06/2022 10:13:22 AM This report has been signed electronically.

## 2022-04-06 NOTE — Patient Instructions (Signed)
Please read handouts provided. Continue present medications. Repeat colonoscopy in 10 years for screening.   YOU HAD AN ENDOSCOPIC PROCEDURE TODAY AT THE [facility]:   Refer to the procedure report that was given to you for any specific questions about what was found during the examination.  If the procedure report does not answer your questions, please call your gastroenterologist to clarify.  If you requested that your care partner not be given the details of your procedure findings, then the procedure report has been included in a sealed envelope for you to review at your convenience later.  YOU SHOULD EXPECT: Some feelings of bloating in the abdomen. Passage of more gas than usual.  Walking can help get rid of the air that was put into your GI tract during the procedure and reduce the bloating. If you had a lower endoscopy (such as a colonoscopy or flexible sigmoidoscopy) you may notice spotting of blood in your stool or on the toilet paper. If you underwent a bowel prep for your procedure, you may not have a normal bowel movement for a few days.  Please Note:  You might notice some irritation and congestion in your nose or some drainage.  This is from the oxygen used during your procedure.  There is no need for concern and it should clear up in a day or so.  SYMPTOMS TO REPORT IMMEDIATELY:  Following lower endoscopy (colonoscopy or flexible sigmoidoscopy):  Excessive amounts of blood in the stool  Significant tenderness or worsening of abdominal pains  Swelling of the abdomen that is new, acute  Fever of 100F or higher.  For urgent or emergent issues, a gastroenterologist can be reached at any hour by calling [phone]. Do not use MyChart messaging for urgent concerns.    DIET:  We do recommend a small meal at first, but then you may proceed to your regular diet.  Drink plenty of fluids but you should avoid alcoholic beverages for 24 hours.  ACTIVITY:  You should  plan to take it easy for the rest of today and you should NOT DRIVE or use heavy machinery until tomorrow (because of the sedation medicines used during the test).    FOLLOW UP: Our staff will call the number listed on your records the next business day following your procedure.  We will call around 7:15- 8:00 am to check on you and address any questions or concerns that you may have regarding the information given to you following your procedure. If we do not reach you, we will leave a message.     If any biopsies were taken you will be contacted by phone or by letter within the next 1-3 weeks.  Please call us at [phone] if you have not heard about the biopsies in 3 weeks.    SIGNATURES/CONFIDENTIALITY: You and/or your care partner have signed paperwork which will be entered into your electronic medical record.  These signatures attest to the fact that that the information above on your After Visit Summary has been reviewed and is understood.  Full responsibility of the confidentiality of this discharge information lies with you and/or your care-partner. 

## 2022-04-06 NOTE — Progress Notes (Signed)
PT taken to PACU. Monitors in place. VSS. Report given to RN. 

## 2022-04-06 NOTE — Progress Notes (Signed)
VS completed by CW.   Pt's states no medical or surgical changes since previsit or office visit.  

## 2022-04-06 NOTE — Progress Notes (Signed)
GASTROENTEROLOGY PROCEDURE H&P NOTE   Primary Care Physician: Antony Contras, MD    Reason for Procedure:   Hx of colon polyps  Plan:    Surveillance colonoscopy  Patient is appropriate for endoscopic procedure(s) in the ambulatory ([place]) setting.  The nature of the procedure, as well as the risks, benefits, and alternatives were carefully and thoroughly reviewed with the patient. Ample time for discussion and questions allowed. The patient understood, was satisfied, and agreed to proceed.     HPI: Robert Carroll is a 66 y.o. male who presents for colonoscopy.  Medical history as below.  Tolerated the prep.  No recent chest pain or shortness of breath.  No abdominal pain today.  Past Medical History:  Diagnosis Date   Allergy    BPH (benign prostatic hyperplasia)    Erectile dysfunction    Genital herpes    GERD (gastroesophageal reflux disease)    Glaucoma, both eyes    History of adenomatous polyp of colon    History of COVID-19 01/2021   per pt positive home test with mild symptoms that resolved   History of partial seizures    pt stated symptoms start with shoulder numbness and goes to fingers  controlled w/ gapabentin;  previously seen by neurologist-- dr Leta Baptist   Hyperlipidemia    Hypertension    followed yb pcp   Lower urinary tract symptoms (LUTS)    Neuromuscular disorder ([place])    OA (osteoarthritis)    OSA on CPAP    per study in [facility] 11-07-2008 mild osa   Sleep apnea    uses cpap    Past Surgical History:  Procedure Laterality Date   BUNIONECTOMY Bilateral    COLONOSCOPY  12/28/2006   COLONOSCOPY   2013   SHOULDER SURGERY  1974, 1977   right   THULIUM LASER TURP (TRANSURETHRAL RESECTION OF PROSTATE) N/A 04/18/2019   Procedure: [name] LASER TURP (TRANSURETHRAL RESECTION OF PROSTATE); TRANSURETHRAL INCISION OF THE PROSTATE;  Surgeon: Festus Aloe, MD;  Location: [facility];  Service: Urology;  Laterality: N/A;   TOTAL HIP  ARTHROPLASTY Right 12/26/2016   Procedure: RIGHT TOTAL HIP ARTHROPLASTY ANTERIOR APPROACH;  Surgeon: Paralee Cancel, MD;  Location: [facility];  Service: Orthopedics;  Laterality: Right;   TOTAL HIP ARTHROPLASTY Left 09/17/2018   Procedure: TOTAL HIP ARTHROPLASTY ANTERIOR APPROACH;  Surgeon: Paralee Cancel, MD;  Location: [facility];  Service: Orthopedics;  Laterality: Left;  70 minutes   TRANSURETHRAL RESECTION OF PROSTATE N/A 03/18/2021   Procedure: TRANSURETHRAL RESECTION OF THE PROSTATE (TURP);  Surgeon: Festus Aloe, MD;  Location: [facility];  Service: Urology;  Laterality: N/A;    Prior to Admission medications   Medication Sig Start Date End Date Taking? Authorizing Provider  acetaminophen (TYLENOL) 325 MG tablet 2 tablets 12/27/16  Yes [provider]  amLODipine-benazepril (LOTREL) 10-20 MG capsule Take 1 capsule by mouth at bedtime.  01/28/12  Yes [provider]  aspirin EC 81 MG tablet Take 81 mg by mouth daily. Swallow whole.   Yes [provider]  atorvastatin (LIPITOR) 20 MG tablet Take 20 mg by mouth every evening.  02/17/16  Yes [provider]  Brimonidine Tartrate (LUMIFY) 0.025 % SOLN Place 1 drop into both eyes daily.   Yes [provider]  doxazosin (CARDURA) 4 MG tablet Take 4 mg by mouth every morning. 01/24/12  Yes [provider]  fluticasone (FLONASE) 50 MCG/ACT nasal spray Place 2 sprays into both nostrils as needed  for allergies or rhinitis.    Yes [provider]  gabapentin (NEURONTIN) 300 MG capsule TAKE 3 CAPSULES BY MOUTH 3  TIMES DAILY Patient taking differently: Take 600 mg by mouth 3 (three) times daily. 02/25/18  Yes Penumalli, Vikram R, MD  latanoprost (XALATAN) 0.005 % ophthalmic solution Place 1 drop into both eyes at bedtime. 09/15/19  Yes [provider]  loratadine (CLARITIN) 10 MG tablet Take 10 mg by mouth daily.   Yes [provider]  Melatonin 3 MG CAPS Take 3-6 mg  by mouth at bedtime as needed (sleep).   Yes [provider]  Multiple Vitamin (MULTIVITAMIN WITH MINERALS) TABS tablet Take 1 tablet by mouth daily.   Yes [provider]  Omega-3 Fatty Acids (FISH OIL) 1000 MG CAPS Take 1,000 mg by mouth daily.    Yes [provider]  omeprazole (PRILOSEC) 20 MG capsule Take 20 mg by mouth daily before supper.  02/17/12  Yes [provider]  Polyethyl Glycol-Propyl Glycol (SYSTANE OP) Place 1 drop into both eyes daily as needed (dry eyes).   Yes [provider]  silodosin (RAPAFLO) 4 MG CAPS capsule Take 4 mg by mouth daily.   Yes [provider]  valACYclovir (VALTREX) 1000 MG tablet Take 500 mg by mouth every morning.  01/22/12  Yes [provider]  diclofenac (VOLTAREN) 75 MG EC tablet Take 75 mg by mouth 2 (two) times daily.    [provider]  diphenhydrAMINE (BENADRYL) 25 MG tablet Take 25 mg by mouth daily as needed.    [provider]  Polyethylene Glycol 3350 (MIRALAX PO) Take by mouth daily as needed.    [provider]  Respiratory Therapy Supplies (CARETOUCH CPAP MASK WIPES) [place]     [provider]  tadalafil (CIALIS) 5 MG tablet Take 5 mg by mouth daily as needed for erectile dysfunction.     [provider]    Current Outpatient Medications  Medication Sig Dispense Refill   acetaminophen (TYLENOL) 325 MG tablet 2 tablets     amLODipine-benazepril (LOTREL) 10-20 MG capsule Take 1 capsule by mouth at bedtime.      aspirin EC 81 MG tablet Take 81 mg by mouth daily. Swallow whole.     atorvastatin (LIPITOR) 20 MG tablet Take 20 mg by mouth every evening.      Brimonidine Tartrate (LUMIFY) 0.025 % SOLN Place 1 drop into both eyes daily.     doxazosin (CARDURA) 4 MG tablet Take 4 mg by mouth every morning.     fluticasone (FLONASE) 50 MCG/ACT nasal spray Place 2 sprays into both nostrils as needed for allergies or rhinitis.      gabapentin  (NEURONTIN) 300 MG capsule TAKE 3 CAPSULES BY MOUTH 3  TIMES DAILY (Patient taking differently: Take 600 mg by mouth 3 (three) times daily.) 810 capsule 0   latanoprost (XALATAN) 0.005 % ophthalmic solution Place 1 drop into both eyes at bedtime.     loratadine (CLARITIN) 10 MG tablet Take 10 mg by mouth daily.     Melatonin 3 MG CAPS Take 3-6 mg by mouth at bedtime as needed (sleep).     Multiple Vitamin (MULTIVITAMIN WITH MINERALS) TABS tablet Take 1 tablet by mouth daily.     Omega-3 Fatty Acids (FISH OIL) 1000 MG CAPS Take 1,000 mg by mouth daily.      omeprazole (PRILOSEC) 20 MG capsule Take 20 mg by mouth daily before supper.      Polyethyl  Glycol-Propyl Glycol (SYSTANE OP) Place 1 drop into both eyes daily as needed (dry eyes).     silodosin (RAPAFLO) 4 MG CAPS capsule Take 4 mg by mouth daily.     valACYclovir (VALTREX) 1000 MG tablet Take 500 mg by mouth every morning.      diclofenac (VOLTAREN) 75 MG EC tablet Take 75 mg by mouth 2 (two) times daily.     diphenhydrAMINE (BENADRYL) 25 MG tablet Take 25 mg by mouth daily as needed.     Polyethylene Glycol 3350 (MIRALAX PO) Take by mouth daily as needed.     Respiratory Therapy Supplies (CARETOUCH CPAP MASK WIPES) MISC      tadalafil (CIALIS) 5 MG tablet Take 5 mg by mouth daily as needed for erectile dysfunction.      Current Facility-Administered Medications  Medication Dose Route Frequency Provider Last Rate Last Admin   0.9 %  sodium chloride infusion  500 mL Intravenous Once Dalon Reichart, Lajuan Lines, MD       Facility-Administered Medications Ordered in Other Visits  Medication Dose Route Frequency Provider Last Rate Last Admin   gadopentetate dimeglumine (MAGNEVIST) injection 20 mL  20 mL Intravenous Once PRN Penumalli, Earlean Polka, MD        Allergies as of 04/06/2022   (No Known Allergies)    Family History  Problem Relation Age of Onset   Heart disease Mother    Stroke Mother    Heart disease Father    Hypertension Father     Diabetes Father    Heart disease Sister    Lupus Sister    Heart disease Brother    Hypertension Brother    Diabetes Brother    Heart disease Maternal Aunt    Heart disease Maternal Uncle    Cancer Maternal Uncle        prostate   Cancer Paternal Uncle        prostate   Colon cancer Neg Hx    Stomach cancer Neg Hx    Colon polyps Neg Hx    Esophageal cancer Neg Hx    Rectal cancer Neg Hx     Social History   Socioeconomic History   Marital status: Married    Spouse name: Not on file   Number of children: 3   Years of education: 16   Highest education level: Not on file  Occupational History    Comment: Architectural technologist, administration  Tobacco Use   Smoking status: Never   Smokeless tobacco: Never  Vaping Use   Vaping Use: Never used  Substance and Sexual Activity   Alcohol use: Yes    Alcohol/week: 5.0 standard drinks of alcohol    Types: 5 Standard drinks or equivalent per week   Drug use: Never   Sexual activity: Yes  Other Topics Concern   Not on file  Social History Narrative   Not on file   Social Determinants of Health   Financial Resource Strain: Not on file  Food Insecurity: Not on file  Transportation Needs: Not on file  Physical Activity: Not on file  Stress: Not on file  Social Connections: Not on file  Intimate Partner Violence: Not on file    Physical Exam: Vital signs in last 24 hours: '@BP'$  131/73   Pulse (!) 58   Temp (!) 96.8 F (36 C) (Temporal)   Ht '6\' 1"'$  (1.854 m)   Wt 226 lb (102.5 kg)   SpO2 99%   BMI 29.82 kg/m  GEN: NAD  EYE: Sclerae anicteric ENT: MMM CV: Non-tachycardic Pulm: CTA b/l GI: Soft, NT/ND NEURO:  Alert & Oriented x 3   Zenovia Jarred, MD [facility]  04/06/2022 9:41 AM

## 2022-04-07 ENCOUNTER — Telehealth: Payer: Self-pay | Admitting: *Deleted

## 2022-04-07 NOTE — Telephone Encounter (Signed)
  Follow up Call-     04/06/2022    8:43 AM  Call back number  Post procedure Call Back phone  # [phone]  Permission to leave phone message Yes     Patient questions:  Do you have a fever, pain , or abdominal swelling? No. Pain Score  0 *  Have you tolerated food without any problems? Yes.    Have you been able to return to your normal activities? Yes.    Do you have any questions about your discharge instructions: Diet   No. Medications  No. Follow up visit  No.  Do you have questions or concerns about your Care? No.  Actions: * If pain score is 4 or above: No action needed, pain <4.

## 2022-04-20 ENCOUNTER — Encounter: Payer: 59 | Admitting: Internal Medicine

## 2022-06-22 ENCOUNTER — Ambulatory Visit (HOSPITAL_COMMUNITY)
Admission: EM | Admit: 2022-06-22 | Discharge: 2022-06-22 | Disposition: A | Discharge status: Home / Self Care | Payer: Medicare Other | Attending: Family Medicine | Admitting: Family Medicine

## 2022-06-22 ENCOUNTER — Ambulatory Visit (INDEPENDENT_AMBULATORY_CARE_PROVIDER_SITE_OTHER): Payer: Medicare Other

## 2022-06-22 ENCOUNTER — Encounter (HOSPITAL_COMMUNITY): Payer: Self-pay | Admitting: Emergency Medicine

## 2022-06-22 DIAGNOSIS — R059 Cough, unspecified: Secondary | ICD-10-CM | POA: Diagnosis not present

## 2022-06-22 DIAGNOSIS — J069 Acute upper respiratory infection, unspecified: Secondary | ICD-10-CM

## 2022-06-22 MED ORDER — BENZONATATE 100 MG PO CAPS: 100.0000 mg | ORAL_CAPSULE | Freq: Three times a day (TID) | ORAL | 0 refills | Status: AC

## 2022-06-22 NOTE — ED Triage Notes (Signed)
Pt reports cold symptoms - runny nose and itchy eyes a week and a half ago. States he tried OTC medication and symptoms resolved. Then he developed chest congestion and a productive cough. States the phlegm has a mild bloody taste. Denies seeing any blood.

## 2022-06-22 NOTE — ED Provider Notes (Signed)
[facility]    CSN: [id number] Arrival date & time: 06/22/22  2202      History   Chief Complaint Chief Complaint  Patient presents with   Cough   Chest Congestion    HPI Robert Carroll is a 66 y.o. male.   Patient is here for uri symptoms.  Started about 10 days runny nose, itchy eyes, got better with otc medications.  He then started again with cough, chest congestion, more mucous.  He lost his voice as well.  Worse at night.   No fevers/chills.  No wheezing or sob.  No h/o asthma or copd.  No tobacco use.        Past Medical History:  Diagnosis Date   Allergy    BPH (benign prostatic hyperplasia)    Erectile dysfunction    Genital herpes    GERD (gastroesophageal reflux disease)    Glaucoma, both eyes    History of adenomatous polyp of colon    History of COVID-19 01/2021   per pt positive home test with mild symptoms that resolved   History of partial seizures    pt stated symptoms start with shoulder numbness and goes to fingers  controlled w/ gapabentin;  previously seen by neurologist-- dr Leta Baptist   Hyperlipidemia    Hypertension    followed yb pcp   Lower urinary tract symptoms (LUTS)    Neuromuscular disorder ([place])    OA (osteoarthritis)    OSA on CPAP    per study in [facility] 11-07-2008 mild osa   Sleep apnea    uses cpap    Patient Active Problem List   Diagnosis Date Noted   Arthrofibrosis of hip joint, left 11/06/2019   S/P left THA, AA 09/17/2018   Status post total hip replacement, left 09/17/2018   Overweight (BMI 25.0-29.9) 12/27/2016   S/P right THA, AA 12/26/2016    Past Surgical History:  Procedure Laterality Date   BUNIONECTOMY Bilateral    COLONOSCOPY  12/28/2006   COLONOSCOPY   2013   SHOULDER SURGERY  1974, 1977   right   THULIUM LASER TURP (TRANSURETHRAL RESECTION OF PROSTATE) N/A 04/18/2019   Procedure: [name] LASER TURP (TRANSURETHRAL RESECTION OF PROSTATE); TRANSURETHRAL INCISION OF THE PROSTATE;  Surgeon:  Festus Aloe, MD;  Location: [place];  Service: Urology;  Laterality: N/A;   TOTAL HIP ARTHROPLASTY Right 12/26/2016   Procedure: RIGHT TOTAL HIP ARTHROPLASTY ANTERIOR APPROACH;  Surgeon: Paralee Cancel, MD;  Location: [facility];  Service: Orthopedics;  Laterality: Right;   TOTAL HIP ARTHROPLASTY Left 09/17/2018   Procedure: TOTAL HIP ARTHROPLASTY ANTERIOR APPROACH;  Surgeon: Paralee Cancel, MD;  Location: [facility];  Service: Orthopedics;  Laterality: Left;  70 minutes   TRANSURETHRAL RESECTION OF PROSTATE N/A 03/18/2021   Procedure: TRANSURETHRAL RESECTION OF THE PROSTATE (TURP);  Surgeon: Festus Aloe, MD;  Location: [facility];  Service: Urology;  Laterality: N/A;       Home Medications    Prior to Admission medications   Medication Sig Start Date End Date Taking? Authorizing Provider  acetaminophen (TYLENOL) 325 MG tablet 2 tablets 12/27/16   [provider]  amLODipine-benazepril (LOTREL) 10-20 MG capsule Take 1 capsule by mouth at bedtime.  01/28/12   [provider]  aspirin EC 81 MG tablet Take 81 mg by mouth daily. Swallow whole.    [provider]  atorvastatin (LIPITOR) 20 MG tablet Take 20 mg by mouth every evening.  02/17/16   [provider]  Brimonidine Tartrate (LUMIFY) 0.025 % SOLN Place 1 drop into both eyes daily.    [provider]  diclofenac (VOLTAREN) 75 MG EC tablet Take 75 mg by mouth 2 (two) times daily.    [provider]  diphenhydrAMINE (BENADRYL) 25 MG tablet Take 25 mg by mouth daily as needed.    [provider]  doxazosin (CARDURA) 4 MG tablet Take 4 mg by mouth every morning. 01/24/12   [provider]  fluticasone (FLONASE) 50 MCG/ACT nasal spray Place 2 sprays into both nostrils as needed for allergies or rhinitis.     [provider]  gabapentin (NEURONTIN) 300 MG capsule TAKE 3 CAPSULES BY MOUTH 3  TIMES DAILY Patient taking differently: Take  600 mg by mouth 3 (three) times daily. 02/25/18   Penumalli, Earlean Polka, MD  latanoprost (XALATAN) 0.005 % ophthalmic solution Place 1 drop into both eyes at bedtime. 09/15/19   [provider]  loratadine (CLARITIN) 10 MG tablet Take 10 mg by mouth daily.    [provider]  Melatonin 3 MG CAPS Take 3-6 mg by mouth at bedtime as needed (sleep).    [provider]  Multiple Vitamin (MULTIVITAMIN WITH MINERALS) TABS tablet Take 1 tablet by mouth daily.    [provider]  Omega-3 Fatty Acids (FISH OIL) 1000 MG CAPS Take 1,000 mg by mouth daily.     [provider]  omeprazole (PRILOSEC) 20 MG capsule Take 20 mg by mouth daily before supper.  02/17/12   [provider]  Polyethyl Glycol-Propyl Glycol (SYSTANE OP) Place 1 drop into both eyes daily as needed (dry eyes).    [provider]  Polyethylene Glycol 3350 (MIRALAX PO) Take by mouth daily as needed.    [provider]  Respiratory Therapy Supplies (CARETOUCH CPAP MASK WIPES) [place]     [provider]  silodosin (RAPAFLO) 4 MG CAPS capsule Take 4 mg by mouth daily.    [provider]  tadalafil (CIALIS) 5 MG tablet Take 5 mg by mouth daily as needed for erectile dysfunction.     [provider]  valACYclovir (VALTREX) 1000 MG tablet Take 500 mg by mouth every morning.  01/22/12   [provider]    Family History Family History  Problem Relation Age of Onset   Heart disease Mother    Stroke Mother    Heart disease Father    Hypertension Father    Diabetes Father    Heart disease Sister    Lupus Sister    Heart disease Brother    Hypertension Brother    Diabetes Brother    Heart disease Maternal Aunt    Heart disease Maternal Uncle    Cancer Maternal Uncle        prostate   Cancer Paternal Uncle        prostate   Colon cancer Neg Hx    Stomach cancer Neg Hx    Colon polyps Neg Hx    Esophageal cancer Neg Hx    Rectal cancer Neg  Hx     Social History Social History   Tobacco Use   Smoking status: Never   Smokeless tobacco: Never  Vaping Use   Vaping Use: Never used  Substance Use Topics   Alcohol use: Yes    Alcohol/week: 5.0 standard drinks of alcohol    Types: 5 Standard drinks or equivalent per week   Drug use: Never     Allergies   Patient has  no known allergies.   Review of Systems Review of Systems  Constitutional:  Negative for chills and fever.  HENT:  Positive for congestion and rhinorrhea.   Respiratory:  Positive for cough. Negative for shortness of breath.   Cardiovascular: Negative.   Gastrointestinal: Negative.   Musculoskeletal: Negative.   Psychiatric/Behavioral: Negative.       Physical Exam Triage Vital Signs ED Triage Vitals  Enc Vitals Group     BP 06/22/22 0919 131/77     Pulse Rate 06/22/22 0919 79     Resp 06/22/22 0919 17     Temp 06/22/22 0919 98.6 F (37 C)     Temp Source 06/22/22 0919 Oral     SpO2 06/22/22 0919 95 %     Weight --      Height --      Head Circumference --      Peak Flow --      Pain Score 06/22/22 0918 0     Pain Loc --      Pain Edu? --      Excl. in [place]? --    No data found.  Updated Vital Signs BP 131/77   Pulse 79   Temp 98.6 F (37 C) (Oral)   Resp 17   SpO2 95%   Visual Acuity Right Eye Distance:   Left Eye Distance:   Bilateral Distance:    Right Eye Near:   Left Eye Near:    Bilateral Near:     Physical Exam Constitutional:      Appearance: Normal appearance.  HENT:     Nose: Congestion present.  Cardiovascular:     Rate and Rhythm: Normal rate and regular rhythm.  Pulmonary:     Effort: Pulmonary effort is normal.     Breath sounds: Normal breath sounds. No wheezing.  Musculoskeletal:     Cervical back: Normal range of motion and neck supple. No tenderness.  Lymphadenopathy:     Cervical: No cervical adenopathy.  Skin:    General: Skin is warm.  Neurological:     General: No focal deficit present.      Mental Status: He is alert.  Psychiatric:        Mood and Affect: Mood normal.      UC Treatments / Results  Labs (all labs ordered are listed, but only abnormal results are displayed) Labs Reviewed - No data to display  EKG   Radiology DG Chest 2 View  Result Date: 06/22/2022 CLINICAL DATA:  Cough EXAM: CHEST - 2 VIEW COMPARISON:  None Available. FINDINGS: The heart size and mediastinal contours are within normal limits. Both lungs are clear. The visualized skeletal structures are unremarkable. IMPRESSION: No active cardiopulmonary disease. Electronically Signed   By: Fidela Salisbury M.D.   On: 06/22/2022 09:54    Procedures Procedures (including critical care time)  Medications Ordered in UC Medications - No data to display  Initial Impression / Assessment and Plan / UC Course  I have reviewed the triage vital signs and the nursing notes.  Pertinent labs & imaging results that were available during my care of the patient were reviewed by me and considered in my medical decision making (see chart for details).   Final Clinical Impressions(s) / UC Diagnoses   Final diagnoses:  Viral URI with cough     Discharge Instructions      You were seen today for upper respiratory symptoms.  Your chest xray was normal.  I have sent out a  medication to help with the cough.  Please return if your symptoms are not improving or worsening.     ED Prescriptions     Medication Sig Dispense Auth. Provider   benzonatate (TESSALON) 100 MG capsule Take 1 capsule (100 mg total) by mouth every 8 (eight) hours. 21 capsule Rondel Oh, MD      PDMP not reviewed this encounter.   Rondel Oh, MD 06/22/22 1007

## 2022-06-22 NOTE — Discharge Instructions (Signed)
You were seen today for upper respiratory symptoms.  Your chest xray was normal.  I have sent out a medication to help with the cough.  Please return if your symptoms are not improving or worsening.
